# Patient Record
Sex: Female | Born: 1980
Health system: Southern US, Community
[De-identification: ages and names within clinical notes are randomized; demographics above are authoritative.]

## PROBLEM LIST (undated history)

## (undated) ENCOUNTER — Inpatient Hospital Stay (HOSPITAL_COMMUNITY): Payer: Self-pay

## (undated) DIAGNOSIS — Z8619 Personal history of other infectious and parasitic diseases: Secondary | ICD-10-CM

## (undated) DIAGNOSIS — T7840XA Allergy, unspecified, initial encounter: Secondary | ICD-10-CM

## (undated) DIAGNOSIS — Z789 Other specified health status: Secondary | ICD-10-CM

## (undated) HISTORY — DX: Allergy, unspecified, initial encounter: T78.40XA

## (undated) HISTORY — PX: NO PAST SURGERIES: SHX2092

## (undated) HISTORY — DX: Personal history of other infectious and parasitic diseases: Z86.19

---
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
## 2001-03-18 ENCOUNTER — Encounter: Payer: Self-pay | Admitting: Emergency Medicine

## 2001-03-18 ENCOUNTER — Emergency Department (HOSPITAL_COMMUNITY): Admission: EM | Admit: 2001-03-18 | Discharge: 2001-03-18 | Payer: Self-pay | Admitting: Emergency Medicine

## 2003-04-01 ENCOUNTER — Encounter: Admission: RE | Admit: 2003-04-01 | Discharge: 2003-04-01 | Payer: Self-pay | Admitting: Gastroenterology

## 2003-04-01 ENCOUNTER — Encounter: Payer: Self-pay | Admitting: Gastroenterology

## 2003-05-01 ENCOUNTER — Ambulatory Visit (HOSPITAL_COMMUNITY): Admission: RE | Admit: 2003-05-01 | Discharge: 2003-05-01 | Payer: Self-pay | Admitting: Surgery

## 2003-05-23 ENCOUNTER — Other Ambulatory Visit: Admission: RE | Admit: 2003-05-23 | Discharge: 2003-05-23 | Payer: Self-pay | Admitting: Obstetrics and Gynecology

## 2003-05-24 ENCOUNTER — Other Ambulatory Visit: Admission: RE | Admit: 2003-05-24 | Discharge: 2003-05-24 | Payer: Self-pay | Admitting: Obstetrics & Gynecology

## 2003-11-16 ENCOUNTER — Inpatient Hospital Stay (HOSPITAL_COMMUNITY): Admission: AD | Admit: 2003-11-16 | Discharge: 2003-11-16 | Payer: Self-pay | Admitting: Obstetrics and Gynecology

## 2004-01-13 ENCOUNTER — Inpatient Hospital Stay (HOSPITAL_COMMUNITY): Admission: AD | Admit: 2004-01-13 | Discharge: 2004-01-16 | Payer: Self-pay | Admitting: Obstetrics and Gynecology

## 2005-03-04 ENCOUNTER — Emergency Department (HOSPITAL_COMMUNITY): Admission: EM | Admit: 2005-03-04 | Discharge: 2005-03-04 | Payer: Self-pay | Admitting: Emergency Medicine

## 2005-12-31 ENCOUNTER — Other Ambulatory Visit: Admission: RE | Admit: 2005-12-31 | Discharge: 2005-12-31 | Payer: Self-pay | Admitting: Obstetrics and Gynecology

## 2006-08-27 ENCOUNTER — Inpatient Hospital Stay (HOSPITAL_COMMUNITY): Admission: AD | Admit: 2006-08-27 | Discharge: 2006-08-27 | Payer: Self-pay | Admitting: Obstetrics and Gynecology

## 2006-08-28 ENCOUNTER — Inpatient Hospital Stay (HOSPITAL_COMMUNITY): Admission: AD | Admit: 2006-08-28 | Discharge: 2006-08-30 | Payer: Self-pay | Admitting: Obstetrics & Gynecology

## 2009-11-24 ENCOUNTER — Ambulatory Visit (HOSPITAL_COMMUNITY): Admission: RE | Admit: 2009-11-24 | Discharge: 2009-11-24 | Payer: Self-pay | Admitting: Family Medicine

## 2012-08-24 ENCOUNTER — Emergency Department (HOSPITAL_COMMUNITY): Payer: Self-pay

## 2012-08-24 ENCOUNTER — Emergency Department (HOSPITAL_COMMUNITY)
Admission: EM | Admit: 2012-08-24 | Discharge: 2012-08-25 | Disposition: A | Discharge status: Home / Self Care | Payer: Self-pay | Attending: Emergency Medicine | Admitting: Emergency Medicine

## 2012-08-24 ENCOUNTER — Encounter (HOSPITAL_COMMUNITY): Payer: Self-pay | Admitting: Emergency Medicine

## 2012-08-24 DIAGNOSIS — Y9364 Activity, baseball: Secondary | ICD-10-CM | POA: Insufficient documentation

## 2012-08-24 DIAGNOSIS — W010XXA Fall on same level from slipping, tripping and stumbling without subsequent striking against object, initial encounter: Secondary | ICD-10-CM | POA: Insufficient documentation

## 2012-08-24 DIAGNOSIS — S40019A Contusion of unspecified shoulder, initial encounter: Secondary | ICD-10-CM | POA: Insufficient documentation

## 2012-08-24 DIAGNOSIS — S40012A Contusion of left shoulder, initial encounter: Secondary | ICD-10-CM

## 2012-08-24 MED ORDER — HYDROCODONE-ACETAMINOPHEN 5-325 MG PO TABS: 1.0000 | ORAL_TABLET | Freq: Four times a day (QID) | ORAL | Status: AC | PRN

## 2012-08-24 MED ORDER — IBUPROFEN 800 MG PO TABS
800.0000 mg | ORAL_TABLET | Freq: Once | ORAL | Status: AC
  Administered 2012-08-24: 800 mg via ORAL
  Filled 2012-08-24: qty 1

## 2012-08-24 MED ORDER — HYDROCODONE-ACETAMINOPHEN 5-325 MG PO TABS
1.0000 | ORAL_TABLET | Freq: Once | ORAL | Status: AC
  Administered 2012-08-24: 1 via ORAL
  Filled 2012-08-24: qty 1

## 2012-08-24 NOTE — ED Notes (Signed)
Patient states she was diving to catch a ball and fell on left shoulder.

## 2012-08-24 NOTE — Discharge Instructions (Signed)
Contusion A contusion is a deep bruise. Contusions happen when an injury causes bleeding under the skin. Signs of bruising include pain, puffiness (swelling), and discolored skin. The contusion may turn blue, purple, or yellow. HOME CARE   Put ice on the injured area.  Put ice in a plastic bag.  Place a towel between your skin and the bag.  Leave the ice on for 15 to 20 minutes, 3 to 4 times a day.  Only take medicine as told by your doctor.  Rest the injured area.  If possible, raise (elevate) the injured area to lessen puffiness. GET HELP RIGHT AWAY IF:   You have more bruising or puffiness.  You have pain that is getting worse.  Your puffiness or pain is not helped by medicine. MAKE SURE YOU:   Understand these instructions.  Will watch your condition.  Will get help right away if you are not doing well or get worse. Document Released: 04/12/2008 Document Revised: 01/17/2012 Document Reviewed: 08/30/2011 [facility] Patient Information 2013 [place], [place]. Cryotherapy Cryotherapy means treatment with cold. Ice or gel packs can be used to reduce both pain and swelling. Ice is the most helpful within the first 24 to 48 hours after an injury or flareup from overusing a muscle or joint. Sprains, strains, spasms, burning pain, shooting pain, and aches can all be eased with ice. Ice can also be used when recovering from surgery. Ice is effective, has very few side effects, and is safe for most people to use. PRECAUTIONS  Ice is not a safe treatment option for people with:  Raynaud's phenomenon. This is a condition affecting small blood vessels in the extremities. Exposure to cold may cause your problems to return.  Cold hypersensitivity. There are many forms of cold hypersensitivity, including:  Cold urticaria. Red, itchy hives appear on the skin when the tissues begin to warm after being iced.  Cold erythema. This is a red, itchy rash caused by exposure to cold.  Cold  hemoglobinuria. Red blood cells break down when the tissues begin to warm after being iced. The hemoglobin that carry oxygen are passed into the urine because they cannot combine with blood proteins fast enough.  Numbness or altered sensitivity in the area being iced. If you have any of the following conditions, do not use ice until you have discussed cryotherapy with your caregiver:  Heart conditions, such as arrhythmia, angina, or chronic heart disease.  High blood pressure.  Healing wounds or open skin in the area being iced.  Current infections.  Rheumatoid arthritis.  Poor circulation.  Diabetes. Ice slows the blood flow in the region it is applied. This is beneficial when trying to stop inflamed tissues from spreading irritating chemicals to surrounding tissues. However, if you expose your skin to cold temperatures for too long or without the proper protection, you can damage your skin or nerves. Watch for signs of skin damage due to cold. HOME CARE INSTRUCTIONS Follow these tips to use ice and cold packs safely.  Place a dry or damp towel between the ice and skin. A damp towel will cool the skin more quickly, so you may need to shorten the time that the ice is used.  For a more rapid response, add gentle compression to the ice.  Ice for no more than 10 to 20 minutes at a time. The bonier the area you are icing, the less time it will take to get the benefits of ice.  Check your skin after 5 minutes  to make sure there are no signs of a poor response to cold or skin damage.  Rest 20 minutes or more in between uses.  Once your skin is numb, you can end your treatment. You can test numbness by very lightly touching your skin. The touch should be so light that you do not see the skin dimple from the pressure of your fingertip. When using ice, most people will feel these normal sensations in this order: cold, burning, aching, and numbness.  Do not use ice on someone who cannot  communicate their responses to pain, such as small children or people with dementia. HOW TO MAKE AN ICE PACK Ice packs are the most common way to use ice therapy. Other methods include ice massage, ice baths, and cryo-sprays. Muscle creams that cause a cold, tingly feeling do not offer the same benefits that ice offers and should not be used as a substitute unless recommended by your caregiver. To make an ice pack, do one of the following:  Place crushed ice or a bag of frozen vegetables in a sealable plastic bag. Squeeze out the excess air. Place this bag inside another plastic bag. Slide the bag into a pillowcase or place a damp towel between your skin and the bag.  Mix 3 parts water with 1 part rubbing alcohol. Freeze the mixture in a sealable plastic bag. When you remove the mixture from the freezer, it will be slushy. Squeeze out the excess air. Place this bag inside another plastic bag. Slide the bag into a pillowcase or place a damp towel between your skin and the bag. SEEK MEDICAL CARE IF:  You develop white spots on your skin. This may give the skin a blotchy (mottled) appearance.  Your skin turns blue or pale.  Your skin becomes waxy or hard.  Your swelling gets worse. MAKE SURE YOU:   Understand these instructions.  Will watch your condition.  Will get help right away if you are not doing well or get worse. Document Released: 06/21/2011 Document Revised: 01/17/2012 Document Reviewed: 06/21/2011 [facility] Patient Information 2013 [place], [place].   Wear the sling for comfort.  Take ibuprofen up to 800 mg every 8 hrs with food.  Take the pain medicine as directed.  Apply ice several times daily.  Follow up with dr. Hilda Lias.

## 2012-08-24 NOTE — ED Provider Notes (Signed)
History     CSN: [id number]  Arrival date & time 08/24/12  2238   First MD Initiated Contact with Patient 08/24/12 2258      Chief Complaint  Patient presents with  . Shoulder Injury    (Consider location/radiation/quality/duration/timing/severity/associated sxs/prior treatment) HPI Comments: Pt was playing softball tonight.  She dove and caught a fly ball and landed on her L shoulder.  No other injuries or complaints.  R hand dominant.  Patient is a 31 y.o. female presenting with shoulder injury. The history is provided by the patient. No language interpreter was used.  Shoulder Injury This is a new problem. The current episode started today. The problem occurs constantly. The problem has been unchanged. Pertinent negatives include no chills, fever or weakness. She has tried nothing for the symptoms.    History reviewed. No pertinent past medical history.  History reviewed. No pertinent past surgical history.  No family history on file.  History  Substance Use Topics  . Smoking status: Never Smoker   . Smokeless tobacco: Not on file  . Alcohol Use: Yes     occ    OB History    Grav Para Term Preterm Abortions TAB SAB Ect Mult Living                  Review of Systems  Constitutional: Negative for fever and chills.  Musculoskeletal:       Shoulder injury.    Neurological: Negative for weakness.       Tingling in L arm   All other systems reviewed and are negative.    Allergies  Review of patient's allergies indicates no known allergies.  Home Medications   Current Outpatient Rx  Name Route Sig Dispense Refill  . HYDROCODONE-ACETAMINOPHEN 5-325 MG PO TABS Oral Take 1 tablet by mouth every 6 (six) hours as needed for pain. 12 tablet 0    BP 152/93  Pulse 73  Temp 97.3 F (36.3 C) (Oral)  Resp 20  Ht 5\' 10"  (1.778 m)  Wt 155 lb (70.308 kg)  BMI 22.24 kg/m2  SpO2 100%  Physical Exam  Nursing note and vitals reviewed. Constitutional: She is  oriented to person, place, and time. She appears well-developed and well-nourished. No distress.  HENT:  Head: Normocephalic and atraumatic.  Eyes: EOM are normal.  Neck: Normal range of motion.  Cardiovascular: Normal rate, regular rhythm and normal heart sounds.   Pulmonary/Chest: Effort normal.  Abdominal: Soft. She exhibits no distension. There is no tenderness.  Musculoskeletal: She exhibits tenderness.       Left shoulder: She exhibits decreased range of motion, tenderness, bony tenderness and pain. She exhibits no swelling, no effusion, no crepitus, no deformity, no laceration, no spasm, normal pulse and normal strength.       Arms: Neurological: She is alert and oriented to person, place, and time. Coordination normal.  Skin: Skin is warm and dry.  Psychiatric: She has a normal mood and affect. Judgment normal.    ED Course  Procedures (including critical care time)  Labs Reviewed - No data to display Dg Shoulder Left  08/24/2012  *RADIOLOGY REPORT*  Clinical Data: Shoulder injury.  Pain and swelling.  LEFT SHOULDER - 2+ VIEW  Comparison: None.  Findings: Somewhat prominent distal left clavicle which could be degenerative or related to remote trauma.  No acute fracture or dislocation. Visualized portion of the left hemithorax is normal.  IMPRESSION: No acute osseous abnormality.   Original Report Authenticated By: Ronaldo Miyamoto  D. TALBOT, M.D.      1. Contusion of left shoulder       MDM  Sling, ice, ibuprofen rx-hydrocodone, 12 F/u with dr. Hilda Lias.        Evalina Field, [place] 08/24/12 2340

## 2012-08-25 NOTE — ED Notes (Signed)
Discharge instructions given and reviewed with patient.  Prescription given for Vicodin; effects and use explained.  Patient verbalized understanding of sedating effects of medication and to follow up with orthopedics as needed.  Patient ambulatory; discharged home in good condition.

## 2012-08-25 NOTE — ED Provider Notes (Signed)
Medical screening examination/treatment/procedure(s) were performed by non-physician practitioner and as supervising physician I was immediately available for consultation/collaboration.  Cristiana Yochim L Carletta Feasel, MD 08/25/12 0416 

## 2013-01-03 ENCOUNTER — Encounter: Payer: Self-pay | Admitting: Orthopedic Surgery

## 2013-01-03 ENCOUNTER — Ambulatory Visit: Payer: Self-pay | Admitting: Orthopedic Surgery

## 2015-11-09 NOTE — L&D Delivery Note (Signed)
Patient was C/C/+2 and pushed for 5 minutes with epidural.   NSVD  female infant, Apgars 8,9, weight P.   The patient had no lacerations. Fundus was firm. EBL was expected amount. Placenta was delivered intact. Vagina was clear.  Baby was vigorous and doing skin to skin with mother.  Vanessa Sellers

## 2015-11-19 LAB — OB RESULTS CONSOLE GC/CHLAMYDIA
Chlamydia: NEGATIVE
Gonorrhea: NEGATIVE

## 2015-11-19 LAB — OB RESULTS CONSOLE RPR: RPR: NONREACTIVE

## 2015-11-19 LAB — OB RESULTS CONSOLE HIV ANTIBODY (ROUTINE TESTING): HIV: NONREACTIVE

## 2015-11-19 LAB — OB RESULTS CONSOLE HEPATITIS B SURFACE ANTIGEN: HEP B S AG: NEGATIVE

## 2015-11-19 LAB — OB RESULTS CONSOLE ANTIBODY SCREEN: ANTIBODY SCREEN: NEGATIVE

## 2015-11-19 LAB — OB RESULTS CONSOLE RUBELLA ANTIBODY, IGM: RUBELLA: IMMUNE

## 2015-11-19 LAB — OB RESULTS CONSOLE ABO/RH: RH Type: POSITIVE

## 2015-12-13 ENCOUNTER — Encounter (HOSPITAL_COMMUNITY): Payer: Self-pay | Admitting: *Deleted

## 2015-12-13 ENCOUNTER — Inpatient Hospital Stay (HOSPITAL_COMMUNITY)
Admission: AD | Admit: 2015-12-13 | Discharge: 2015-12-13 | Disposition: A | Discharge status: Home / Self Care | Payer: BLUE CROSS/BLUE SHIELD | Source: Ambulatory Visit | Attending: Obstetrics | Admitting: Obstetrics

## 2015-12-13 DIAGNOSIS — O209 Hemorrhage in early pregnancy, unspecified: Secondary | ICD-10-CM | POA: Insufficient documentation

## 2015-12-13 DIAGNOSIS — O4692 Antepartum hemorrhage, unspecified, second trimester: Secondary | ICD-10-CM

## 2015-12-13 DIAGNOSIS — Z3A17 17 weeks gestation of pregnancy: Secondary | ICD-10-CM | POA: Diagnosis not present

## 2015-12-13 HISTORY — DX: Other specified health status: Z78.9

## 2015-12-13 NOTE — Discharge Instructions (Signed)
Vaginal Bleeding During Pregnancy, Second Trimester °A small amount of bleeding (spotting) from the vagina is relatively common in pregnancy. It usually stops on its own. Various things can cause bleeding or spotting in pregnancy. Some bleeding may be related to the pregnancy, and some may not. Sometimes the bleeding is normal and is not a problem. However, bleeding can also be a sign of something serious. Be sure to tell your health care provider about any vaginal bleeding right away. °Some possible causes of vaginal bleeding during the second trimester include: °· Infection, inflammation, or growths on the cervix.   °· The placenta may be partially or completely covering the opening of the cervix inside the uterus (placenta previa). °· The placenta may have separated from the uterus (abruption of the placenta).   °· You may be having early (preterm) labor.   °· The cervix may not be strong enough to keep a baby inside the uterus (cervical insufficiency).   °· Tiny cysts may have developed in the uterus instead of pregnancy tissue (molar pregnancy).  °HOME CARE INSTRUCTIONS  °Watch your condition for any changes. The following actions may help to lessen any discomfort you are feeling: °· Follow your health care provider's instructions for limiting your activity. If your health care provider orders bed rest, you may need to stay in bed and only get up to use the bathroom. However, your health care provider may allow you to continue light activity. °· If needed, make plans for someone to help with your regular activities and responsibilities while you are on bed rest. °· Keep track of the number of pads you use each day, how often you change pads, and how soaked (saturated) they are. Write this down. °· Do not use tampons. Do not douche. °· Do not have sexual intercourse or orgasms until approved by your health care provider. °· If you pass any tissue from your vagina, save the tissue so you can show it to your  health care provider. °· Only take over-the-counter or prescription medicines as directed by your health care provider. °· Do not take aspirin because it can make you bleed. °· Do not exercise or perform any strenuous activities or heavy lifting without your health care provider's permission. °· Keep all follow-up appointments as directed by your health care provider. °SEEK MEDICAL CARE IF: °· You have any vaginal bleeding during any part of your pregnancy. °· You have cramps or labor pains. °· You have a fever, not controlled by medicine. °SEEK IMMEDIATE MEDICAL CARE IF:  °· You have severe cramps in your back or belly (abdomen). °· You have contractions. °· You have chills. °· You pass large clots or tissue from your vagina. °· Your bleeding increases. °· You feel light-headed or weak, or you have fainting episodes. °· You are leaking fluid or have a gush of fluid from your vagina. °MAKE SURE YOU: °· Understand these instructions. °· Will watch your condition. °· Will get help right away if you are not doing well or get worse. °  °This information is not intended to replace advice given to you by your health care provider. Make sure you discuss any questions you have with your health care provider. °  °Document Released: 08/04/2005 Document Revised: 10/30/2013 Document Reviewed: 07/02/2013 °Elsevier Interactive Patient Education ©2016 [employer] ° ° °Pelvic Rest °Pelvic rest is sometimes recommended for women when:  °· The placenta is partially or completely covering the opening of the cervix (placenta previa). °· There is bleeding between the uterine wall   and the amniotic sac in the first trimester (subchorionic hemorrhage). °· The cervix begins to open without labor starting (incompetent cervix, cervical insufficiency). °· The labor is too early (preterm labor). °HOME CARE INSTRUCTIONS °· Do not have sexual intercourse, stimulation, or an orgasm. °· Do not use tampons, douche, or put anything in the  vagina. °· Do not lift anything over 10 pounds (4.5 kg). °· Avoid strenuous activity or straining your pelvic muscles. °SEEK MEDICAL CARE IF:  °· You have any vaginal bleeding during pregnancy. Treat this as a potential emergency. °· You have cramping pain felt low in the stomach (stronger than menstrual cramps). °· You notice vaginal discharge (watery, mucus, or bloody). °· You have a low, dull backache. °· There are regular contractions or uterine tightening. °SEEK IMMEDIATE MEDICAL CARE IF: °You have vaginal bleeding and have placenta previa.  °  °This information is not intended to replace advice given to you by your health care provider. Make sure you discuss any questions you have with your health care provider. °  °Document Released: 02/19/2011 Document Revised: 01/17/2012 Document Reviewed: 04/28/2015 °Elsevier Interactive Patient Education ©2016 Elsevier Inc. ° °

## 2015-12-13 NOTE — MAU Note (Signed)
Pt reports she satrted seeing blood when she wiped  And is starting to stain her underwear.Pt reports some cramping now and nausea. Pt is very nervous

## 2015-12-13 NOTE — Progress Notes (Signed)
Nada Maclachlan PA in to see pt earlier and discuss d/c plan. Written and verbal d/c instructions given and understanding voiced

## 2015-12-13 NOTE — MAU Provider Note (Signed)
History     CSN: [id number]  Arrival date and time: 12/13/15 2128   First Provider Initiated Contact with Patient 12/13/15 2159      Chief Complaint  Patient presents with  . Vaginal Bleeding   HPI Vanessa Sellers 35 y.o. [id number]  presents to MAU complaining of bleeding.  It was first noted at 6pm today.  It was noted on going to the bathroom initially.  Through the last few hours, she has had slightly more than spotting but not bleeding heavily.  She has not soaked any pads.  She feels a bit crampy on the inside of the vagina and lower stomach.  No problems eating or drinking today.  She has some constipation but denies dysuria, weakness, fever.  She thinks she feels some fetal movements.   OB History    Gravida Para Term Preterm AB TAB SAB Ectopic Multiple Living   Past Medical History  Diagnosis Date  . Medical history non-contributory     Past Surgical History  Procedure Laterality Date  . No past surgeries      History reviewed. No pertinent family history.  Social History  Substance Use Topics  . Smoking status: Never Smoker   . Smokeless tobacco: Never Used  . Alcohol Use: No    Allergies: No Known Allergies  Prescriptions prior to admission  Medication Sig Dispense Refill Last Dose  . acetaminophen (TYLENOL) 325 MG tablet Take 325 mg by mouth every 6 (six) hours as needed for headache.   three weeks  . hydrocortisone cream 1 % Apply 1 application topically daily as needed for itching (rash).   12/12/2015 at Unknown time  . Prenatal Vit-Fe Fumarate-FA (PRENATAL MULTIVITAMIN) TABS tablet Take 1 tablet by mouth daily at 12 noon.   12/12/2015 at Unknown time  . ranitidine (ZANTAC) 150 MG tablet Take 150 mg by mouth 2 (two) times daily as needed for heartburn.   12/12/2015 at Unknown time    ROS Pertinent ROS in HPI.  All other systems are negative.   Physical Exam   Blood pressure 159/85, pulse 94, temperature 98.4 F (36.9 C), temperature  source Oral, resp. rate 18, height  (1.778 m), weight 188 lb 3.2 oz (85.367 kg).  Physical Exam  Constitutional: She is oriented to person, place, and time. She appears well-developed and well-nourished. No distress.  HENT:  Head: Normocephalic and atraumatic.  Eyes: Conjunctivae and EOM are normal.  Neck: Normal range of motion. Neck supple.  Cardiovascular: Normal rate.   Respiratory: Effort normal. No respiratory distress.  GI: Soft. She exhibits no distension. There is no tenderness.  Genitourinary:  Scant streaks of red blood with some brown discharge mixed in.  No active bleeding from os.  No CMT.  No adnexal mass or tenderness appreciated.    Musculoskeletal: Normal range of motion.  Neurological: She is alert and oriented to person, place, and time.  Skin: Skin is warm and dry.  Psychiatric: She has a normal mood and affect. Her behavior is normal.    MAU Course  Procedures  MDM Fetal heart tones 153 on triage. Dr. Chestine Spore consulted.  She advises no formal u/s needed at this time, but would appreciate bedside u/s.  Joellyn Haff, CNM called to bedside for this.  Baby is moving well with good cardiac activity noted and no obvious issue.  Patient reassured.   Per Dr. Chestine Spore - patient may  be discharged.    Assessment and Plan  A: Bleeding in pregnancy   P: Discharge to home  Pelvic rest F/u in clinic If increased bleeding - return to MAU  Patient may return to MAU as needed or if her condition were to change or worsen   Bertram Denver 12/13/2015, 10:00 PM

## 2016-02-25 DIAGNOSIS — Z3480 Encounter for supervision of other normal pregnancy, unspecified trimester: Secondary | ICD-10-CM | POA: Diagnosis not present

## 2016-02-25 DIAGNOSIS — O4403 Placenta previa specified as without hemorrhage, third trimester: Secondary | ICD-10-CM | POA: Diagnosis not present

## 2016-03-17 DIAGNOSIS — Z23 Encounter for immunization: Secondary | ICD-10-CM | POA: Diagnosis not present

## 2016-03-17 DIAGNOSIS — Z36 Encounter for antenatal screening of mother: Secondary | ICD-10-CM | POA: Diagnosis not present

## 2016-04-21 DIAGNOSIS — Z36 Encounter for antenatal screening of mother: Secondary | ICD-10-CM | POA: Diagnosis not present

## 2016-04-21 DIAGNOSIS — Z3480 Encounter for supervision of other normal pregnancy, unspecified trimester: Secondary | ICD-10-CM | POA: Diagnosis not present

## 2016-04-21 LAB — OB RESULTS CONSOLE GBS: GBS: NEGATIVE

## 2016-04-30 ENCOUNTER — Inpatient Hospital Stay (HOSPITAL_COMMUNITY): Payer: BLUE CROSS/BLUE SHIELD

## 2016-04-30 ENCOUNTER — Encounter (HOSPITAL_COMMUNITY): Payer: Self-pay

## 2016-04-30 ENCOUNTER — Inpatient Hospital Stay (HOSPITAL_COMMUNITY)
Admission: AD | Admit: 2016-04-30 | Discharge: 2016-05-03 | DRG: 775 | Disposition: A | Discharge status: Home / Self Care | Payer: BLUE CROSS/BLUE SHIELD | Source: Ambulatory Visit | Attending: Obstetrics and Gynecology | Admitting: Obstetrics and Gynecology

## 2016-04-30 DIAGNOSIS — O4202 Full-term premature rupture of membranes, onset of labor within 24 hours of rupture: Secondary | ICD-10-CM | POA: Diagnosis not present

## 2016-04-30 DIAGNOSIS — Z23 Encounter for immunization: Secondary | ICD-10-CM | POA: Diagnosis not present

## 2016-04-30 DIAGNOSIS — Z36 Encounter for antenatal screening of mother: Secondary | ICD-10-CM | POA: Diagnosis not present

## 2016-04-30 DIAGNOSIS — Z3A37 37 weeks gestation of pregnancy: Secondary | ICD-10-CM | POA: Diagnosis not present

## 2016-04-30 DIAGNOSIS — Z3A36 36 weeks gestation of pregnancy: Secondary | ICD-10-CM

## 2016-04-30 DIAGNOSIS — O4290 Premature rupture of membranes, unspecified as to length of time between rupture and onset of labor, unspecified weeks of gestation: Secondary | ICD-10-CM

## 2016-04-30 DIAGNOSIS — Z3689 Encounter for other specified antenatal screening: Secondary | ICD-10-CM

## 2016-04-30 LAB — CBC
HCT: 32.8 % — ABNORMAL LOW (ref 36.0–46.0)
HEMOGLOBIN: 11.3 g/dL — AB (ref 12.0–15.0)
MCH: 29.5 pg (ref 26.0–34.0)
MCHC: 34.5 g/dL (ref 30.0–36.0)
MCV: 85.6 fL (ref 78.0–100.0)
Platelets: 186 10*3/uL (ref 150–400)
RBC: 3.83 MIL/uL — ABNORMAL LOW (ref 3.87–5.11)
RDW: 13.4 % (ref 11.5–15.5)
WBC: 11 10*3/uL — ABNORMAL HIGH (ref 4.0–10.5)

## 2016-04-30 LAB — TYPE AND SCREEN
ABO/RH(D): B POS
ANTIBODY SCREEN: NEGATIVE

## 2016-04-30 LAB — POCT FERN TEST: POCT FERN TEST: POSITIVE

## 2016-04-30 MED ORDER — SOD CITRATE-CITRIC ACID 500-334 MG/5ML PO SOLN: 30.0000 mL | ORAL | Status: DC | PRN

## 2016-04-30 MED ORDER — LACTATED RINGERS IV SOLN
INTRAVENOUS | Status: DC
  Administered 2016-04-30 – 2016-05-01 (×2): via INTRAVENOUS

## 2016-04-30 MED ORDER — FLEET ENEMA 7-19 GM/118ML RE ENEM: 1.0000 | ENEMA | RECTAL | Status: DC | PRN

## 2016-04-30 MED ORDER — OXYTOCIN 40 UNITS IN LACTATED RINGERS INFUSION - SIMPLE MED: 2.5000 [IU]/h | INTRAVENOUS | Status: DC

## 2016-04-30 MED ORDER — OXYTOCIN 40 UNITS IN LACTATED RINGERS INFUSION - SIMPLE MED
1.0000 m[IU]/min | INTRAVENOUS | Status: DC
  Administered 2016-05-01: 2 m[IU]/min via INTRAVENOUS
  Filled 2016-04-30: qty 1000

## 2016-04-30 MED ORDER — LACTATED RINGERS IV SOLN: 500.0000 mL | INTRAVENOUS | Status: DC | PRN

## 2016-04-30 MED ORDER — ACETAMINOPHEN 325 MG PO TABS
650.0000 mg | ORAL_TABLET | ORAL | Status: DC | PRN
  Administered 2016-05-01: 650 mg via ORAL
  Filled 2016-04-30: qty 2

## 2016-04-30 MED ORDER — BUTORPHANOL TARTRATE 1 MG/ML IJ SOLN: 1.0000 mg | INTRAMUSCULAR | Status: DC | PRN

## 2016-04-30 MED ORDER — OXYCODONE-ACETAMINOPHEN 5-325 MG PO TABS: 2.0000 | ORAL_TABLET | ORAL | Status: DC | PRN

## 2016-04-30 MED ORDER — LIDOCAINE HCL (PF) 1 % IJ SOLN
30.0000 mL | INTRAMUSCULAR | Status: DC | PRN
  Filled 2016-04-30: qty 30

## 2016-04-30 MED ORDER — ONDANSETRON HCL 4 MG/2ML IJ SOLN
4.0000 mg | Freq: Four times a day (QID) | INTRAMUSCULAR | Status: DC | PRN
  Administered 2016-05-01: 4 mg via INTRAVENOUS
  Filled 2016-04-30: qty 2

## 2016-04-30 MED ORDER — TERBUTALINE SULFATE 1 MG/ML IJ SOLN
0.2500 mg | Freq: Once | INTRAMUSCULAR | Status: DC | PRN
  Filled 2016-04-30: qty 1

## 2016-04-30 MED ORDER — OXYCODONE-ACETAMINOPHEN 5-325 MG PO TABS: 1.0000 | ORAL_TABLET | ORAL | Status: DC | PRN

## 2016-04-30 MED ORDER — OXYTOCIN BOLUS FROM INFUSION
500.0000 mL | INTRAVENOUS | Status: DC
  Administered 2016-05-01: 500 mL via INTRAVENOUS

## 2016-04-30 NOTE — MAU Note (Signed)
At 5 pm started leaking ? fluid.   Clear.  Cramping low abd.  Baby moving well. No bleeding.

## 2016-05-01 ENCOUNTER — Inpatient Hospital Stay (HOSPITAL_COMMUNITY): Payer: BLUE CROSS/BLUE SHIELD | Admitting: Anesthesiology

## 2016-05-01 ENCOUNTER — Encounter (HOSPITAL_COMMUNITY): Payer: Self-pay | Admitting: *Deleted

## 2016-05-01 LAB — ABO/RH: ABO/RH(D): B POS

## 2016-05-01 LAB — RPR: RPR Ser Ql: NONREACTIVE

## 2016-05-01 MED ORDER — METHYLERGONOVINE MALEATE 0.2 MG PO TABS
0.2000 mg | ORAL_TABLET | ORAL | Status: DC | PRN
Start: 2016-05-01 — End: 2016-05-03

## 2016-05-01 MED ORDER — WITCH HAZEL-GLYCERIN EX PADS: 1.0000 "application " | MEDICATED_PAD | CUTANEOUS | Status: DC | PRN

## 2016-05-01 MED ORDER — PRENATAL MULTIVITAMIN CH
1.0000 | ORAL_TABLET | Freq: Every day | ORAL | Status: DC
  Administered 2016-05-02 – 2016-05-03 (×2): 1 via ORAL
  Filled 2016-05-01 (×2): qty 1

## 2016-05-01 MED ORDER — PHENYLEPHRINE 40 MCG/ML (10ML) SYRINGE FOR IV PUSH (FOR BLOOD PRESSURE SUPPORT)
80.0000 ug | PREFILLED_SYRINGE | INTRAVENOUS | Status: DC | PRN
  Filled 2016-05-01: qty 5
  Filled 2016-05-01: qty 10

## 2016-05-01 MED ORDER — SODIUM CHLORIDE 0.9 % IV SOLN: 250.0000 mL | INTRAVENOUS | Status: DC | PRN

## 2016-05-01 MED ORDER — SODIUM CHLORIDE 0.9 % IV SOLN
2.0000 g | Freq: Four times a day (QID) | INTRAVENOUS | Status: DC
  Administered 2016-05-01: 2 g via INTRAVENOUS
  Filled 2016-05-01 (×2): qty 2000

## 2016-05-01 MED ORDER — LACTATED RINGERS IV SOLN: 500.0000 mL | Freq: Once | INTRAVENOUS | Status: DC

## 2016-05-01 MED ORDER — SODIUM CHLORIDE 0.9% FLUSH: 3.0000 mL | Freq: Two times a day (BID) | INTRAVENOUS | Status: DC

## 2016-05-01 MED ORDER — DIPHENHYDRAMINE HCL 50 MG/ML IJ SOLN: 12.5000 mg | INTRAMUSCULAR | Status: DC | PRN

## 2016-05-01 MED ORDER — SODIUM CHLORIDE 0.9% FLUSH: 3.0000 mL | INTRAVENOUS | Status: DC | PRN

## 2016-05-01 MED ORDER — DIBUCAINE 1 % RE OINT: 1.0000 "application " | TOPICAL_OINTMENT | RECTAL | Status: DC | PRN

## 2016-05-01 MED ORDER — IBUPROFEN 800 MG PO TABS
800.0000 mg | ORAL_TABLET | Freq: Three times a day (TID) | ORAL | Status: DC
  Administered 2016-05-01 – 2016-05-03 (×5): 800 mg via ORAL
  Filled 2016-05-01 (×5): qty 1

## 2016-05-01 MED ORDER — FERROUS SULFATE 325 (65 FE) MG PO TABS
325.0000 mg | ORAL_TABLET | Freq: Two times a day (BID) | ORAL | Status: DC
  Administered 2016-05-02 – 2016-05-03 (×3): 325 mg via ORAL
  Filled 2016-05-01 (×3): qty 1

## 2016-05-01 MED ORDER — COCONUT OIL OIL
1.0000 "application " | TOPICAL_OIL | Status: DC | PRN
  Administered 2016-05-03: 1 via TOPICAL
  Filled 2016-05-01: qty 120

## 2016-05-01 MED ORDER — ZOLPIDEM TARTRATE 5 MG PO TABS: 5.0000 mg | ORAL_TABLET | Freq: Every evening | ORAL | Status: DC | PRN

## 2016-05-01 MED ORDER — PHENYLEPHRINE 40 MCG/ML (10ML) SYRINGE FOR IV PUSH (FOR BLOOD PRESSURE SUPPORT)
80.0000 ug | PREFILLED_SYRINGE | INTRAVENOUS | Status: DC | PRN
  Filled 2016-05-01: qty 5

## 2016-05-01 MED ORDER — ONDANSETRON HCL 4 MG PO TABS: 4.0000 mg | ORAL_TABLET | ORAL | Status: DC | PRN

## 2016-05-01 MED ORDER — ONDANSETRON HCL 4 MG/2ML IJ SOLN: 4.0000 mg | INTRAMUSCULAR | Status: DC | PRN

## 2016-05-01 MED ORDER — OXYCODONE-ACETAMINOPHEN 5-325 MG PO TABS: 2.0000 | ORAL_TABLET | ORAL | Status: DC | PRN

## 2016-05-01 MED ORDER — SIMETHICONE 80 MG PO CHEW: 80.0000 mg | CHEWABLE_TABLET | ORAL | Status: DC | PRN

## 2016-05-01 MED ORDER — EPHEDRINE 5 MG/ML INJ
10.0000 mg | INTRAVENOUS | Status: DC | PRN
  Filled 2016-05-01: qty 2

## 2016-05-01 MED ORDER — DIPHENHYDRAMINE HCL 25 MG PO CAPS: 25.0000 mg | ORAL_CAPSULE | Freq: Four times a day (QID) | ORAL | Status: DC | PRN

## 2016-05-01 MED ORDER — TETANUS-DIPHTH-ACELL PERTUSSIS 5-2.5-18.5 LF-MCG/0.5 IM SUSP: 0.5000 mL | Freq: Once | INTRAMUSCULAR | Status: DC

## 2016-05-01 MED ORDER — ACETAMINOPHEN 325 MG PO TABS: 650.0000 mg | ORAL_TABLET | ORAL | Status: DC | PRN

## 2016-05-01 MED ORDER — SENNOSIDES-DOCUSATE SODIUM 8.6-50 MG PO TABS
2.0000 | ORAL_TABLET | ORAL | Status: DC
  Administered 2016-05-01: 2 via ORAL
  Filled 2016-05-01: qty 2

## 2016-05-01 MED ORDER — FAMOTIDINE 20 MG PO TABS
10.0000 mg | ORAL_TABLET | Freq: Two times a day (BID) | ORAL | Status: DC
  Administered 2016-05-01 – 2016-05-03 (×3): 10 mg via ORAL
  Filled 2016-05-01 (×4): qty 1

## 2016-05-01 MED ORDER — MAGNESIUM HYDROXIDE 400 MG/5ML PO SUSP: 30.0000 mL | ORAL | Status: DC | PRN

## 2016-05-01 MED ORDER — BENZOCAINE-MENTHOL 20-0.5 % EX AERO
1.0000 "application " | INHALATION_SPRAY | CUTANEOUS | Status: DC | PRN
  Administered 2016-05-02: 1 via TOPICAL
  Filled 2016-05-01: qty 56

## 2016-05-01 MED ORDER — FENTANYL 2.5 MCG/ML BUPIVACAINE 1/10 % EPIDURAL INFUSION (WH - ANES)
14.0000 mL/h | INTRAMUSCULAR | Status: DC | PRN
  Administered 2016-05-01 (×2): 14 mL/h via EPIDURAL
  Filled 2016-05-01 (×2): qty 125

## 2016-05-01 MED ORDER — MEASLES, MUMPS & RUBELLA VAC ~~LOC~~ INJ
0.5000 mL | INJECTION | Freq: Once | SUBCUTANEOUS | Status: DC
  Filled 2016-05-01: qty 0.5

## 2016-05-01 MED ORDER — METHYLERGONOVINE MALEATE 0.2 MG/ML IJ SOLN: 0.2000 mg | INTRAMUSCULAR | Status: DC | PRN

## 2016-05-01 MED ORDER — OXYCODONE-ACETAMINOPHEN 5-325 MG PO TABS: 1.0000 | ORAL_TABLET | ORAL | Status: DC | PRN

## 2016-05-01 MED ORDER — LIDOCAINE HCL (PF) 1 % IJ SOLN
INTRAMUSCULAR | Status: DC | PRN
  Administered 2016-05-01: 4 mL
  Administered 2016-05-01: 6 mL via EPIDURAL

## 2016-05-01 NOTE — Anesthesia Preprocedure Evaluation (Signed)

## 2016-05-01 NOTE — H&P (Signed)
35 y.o. [id number]  [id number] comes in c/o rupture at 1700 yesterday.  Otherwise has good fetal movement and no bleeding.  Past Medical History  Diagnosis Date  . Medical history non-contributory     Past Surgical History  Procedure Laterality Date  . No past surgeries      OB History  Gravida Para Term Preterm AB SAB TAB Ectopic Multiple Living  4 2 2  1 1    2     # Outcome Date GA Lbr Len/2nd Weight Sex Delivery Anes PTL Lv  4 Current           3 SAB           2 Term           1 Term               Social History   Social History  . Marital Status: Married    Spouse Name: N/A  . Number of Children: N/A  . Years of Education: N/A   Occupational History  . Not on file.   Social History Main Topics  . Smoking status: Never Smoker   . Smokeless tobacco: Never Used  . Alcohol Use: No  . Drug Use: No  . Sexual Activity: Yes    Birth Control/ Protection: None   Other Topics Concern  . Not on file   Social History Narrative   Review of patient's allergies indicates no known allergies.    Prenatal Transfer Tool  Maternal Diabetes: No Genetic Screening: Normal Maternal Ultrasounds/Referrals: Normal Fetal Ultrasounds or other Referrals:  None Maternal Substance Abuse:  No Significant Maternal Medications:  None Significant Maternal Lab Results: None  Other PNC: uncomplicated.    Filed Vitals:   05/01/16 0600 05/01/16 0630  BP: 120/73 121/82  Pulse: 70 75  Temp:    Resp:       Lungs/Cor:  NAD Abdomen:  soft, gravid Ex:  no cords, erythema SVE:  3/60/-2 FHTs:  120s, good STV, NST R Toco:  q 3-4   A/P   PROM- on pitocin now.  GBS neg.   Taci Sterling A

## 2016-05-01 NOTE — Anesthesia Pain Management Evaluation Note (Signed)
  CRNA Pain Management Visit Note  Patient: Vanessa Sellers, 35 y.o., female  "Hello I am a [facility] of the [facility]. We have an anesthesia team available at all times to provide care throughout the hospital, including epidural management and anesthesia for C-section. I don't know your plan for the delivery whether it a natural birth, water birth, IV sedation, nitrous supplementation, doula or epidural, but we want to meet your pain goals."   1.Was your pain managed to your expectations on prior hospitalizations?   Unable to assess - patient sleeping  2.What is your expectation for pain management during this hospitalization?   Patient sleeing  3.How can we help you reach that goal? Patient sleeping  Record the patient's initial score and the patient's pain goal.   Pain: Patient sleeping - unable to assess  Pain Goal: Patient sleeping - unable to assess [facility] wants you to be able to say your pain was always managed very well.  [facility],Orvell Careaga 05/01/2016

## 2016-05-01 NOTE — Anesthesia Procedure Notes (Signed)
Epidural Patient location during procedure: OB  Preanesthetic Checklist Completed: patient identified, site marked, surgical consent, pre-op evaluation, timeout performed, IV checked, risks and benefits discussed and monitors and equipment checked  Epidural Patient position: sitting Prep: site prepped and draped and DuraPrep Patient monitoring: continuous pulse ox and blood pressure Approach: midline Location: L3-L4 Injection technique: LOR air  Needle:  Needle type: Tuohy  Needle gauge: 17 G Needle length: 9 cm and 9 Needle insertion depth: 5 cm cm Catheter type: closed end flexible Catheter size: 19 Gauge Catheter at skin depth: 10 cm Test dose: negative  Assessment Events: blood not aspirated, injection not painful, no injection resistance, negative IV test and no paresthesia  Additional Notes Scoliosis noted  Dosing of Epidural:  1st dose, through catheter ............................................Marland Kitchen.  Xylocaine 40 mg  2nd dose, through catheter, after waiting 3 minutes........Marland Kitchen.Xylocaine 60 mg    As each dose occurred, patient was free of IV sx; and patient exhibited no evidence of SA injection.  Patient is more comfortable after epidural dosed. Please see RN's note for documentation of vital signs,and FHR which are stable.  Patient reminded not to try to ambulate with numb legs, and that an RN must be present when she attempts to get up.

## 2016-05-01 NOTE — Anesthesia Pain Management Evaluation Note (Signed)
  CRNA Pain Management Visit Note  Patient: Vanessa Sellers, 35 y.o., female  "Hello I am a member of the anesthesia team at [facility]. We have an anesthesia team available at all times to provide care throughout the hospital, including epidural management and anesthesia for C-section. I don't know your plan for the delivery whether it a natural birth, water birth, IV sedation, nitrous supplementation, doula or epidural, but we want to meet your pain goals."   1.Was your pain managed to your expectations on prior hospitalizations?     2.What is your expectation for pain management during this hospitalization?      3.How can we help you reach that goal?   Record the patient's initial score and the patient's pain goal.   Pain: 7  Pain Goal: 4 [facility] wants you to be able to say your pain was always managed very well.  Laban EmperorMalinova,Izick Gasbarro Hristova 05/01/2016

## 2016-05-02 LAB — CBC
HEMATOCRIT: 30.4 % — AB (ref 36.0–46.0)
Hemoglobin: 10.5 g/dL — ABNORMAL LOW (ref 12.0–15.0)
MCH: 29.9 pg (ref 26.0–34.0)
MCHC: 34.5 g/dL (ref 30.0–36.0)
MCV: 86.6 fL (ref 78.0–100.0)
Platelets: 157 10*3/uL (ref 150–400)
RBC: 3.51 MIL/uL — ABNORMAL LOW (ref 3.87–5.11)
RDW: 13.7 % (ref 11.5–15.5)
WBC: 16.2 10*3/uL — ABNORMAL HIGH (ref 4.0–10.5)

## 2016-05-02 NOTE — Discharge Summary (Signed)
Obstetric Discharge Summary Reason for Admission: rupture of membranes Prenatal Procedures: ultrasound Intrapartum Procedures: spontaneous vaginal delivery Postpartum Procedures: none Complications-Operative and Postpartum: none HEMOGLOBIN  Date Value Ref Range Status  05/02/2016 10.5* 12.0 - 15.0 g/dL Final   HCT  Date Value Ref Range Status  05/02/2016 30.4* 36.0 - 46.0 % Final   Discharge Diagnoses: Term Pregnancy-delivered  Discharge Information: Date: 05/02/2016 Activity: pelvic rest Diet: routine Medications: None Condition: stable Instructions: refer to practice specific booklet Discharge to: home Follow-up Information    Follow up with Manessa Buley A, MD In 4 weeks.   Specialty:  Obstetrics and Gynecology   Contact information:   [address] [place] [address] [phone]       Newborn Data: Live born female  Birth Weight: 7 lb 1.4 oz (3215 g) APGAR: 8, 9  Home with mother.  Shervon Kerwin A 05/02/2016, 6:35 AM

## 2016-05-02 NOTE — Lactation Note (Signed)
This note was copied from a baby's chart. Lactation Consultation Note  Experienced BF mother reports that baby tolerated last feeding well.  Parents are aware that another adult needs to be present in the event that the baby has another dusky period and the emergency call bell needs to be activated.  Mother denies any questions for the lactation consultant at this time. Has information on support groups and outpatient services.  Patient Name: Vanessa Sellers ZOXWR'UToday's Date: 05/02/2016     Maternal Data    Feeding Feeding Type: Breast Fed Length of feed: 40 min  LATCH Score/Interventions                      Lactation Tools Discussed/Used     Consult Status      Soyla DryerJoseph, Brycelyn Gambino 05/02/2016, 4:59 PM

## 2016-05-02 NOTE — Anesthesia Postprocedure Evaluation (Signed)
Anesthesia Post Note  Patient: Vanessa Sellers  Procedure(s) Performed: * No procedures listed *  Patient location during evaluation: Mother Baby Anesthesia Type: Epidural Level of consciousness: awake and alert, oriented and patient cooperative Pain management: pain level controlled Vital Signs Assessment: post-procedure vital signs reviewed and stable Respiratory status: spontaneous breathing Cardiovascular status: stable Postop Assessment: no headache, epidural receding, patient able to bend at knees and no signs of nausea or vomiting Anesthetic complications: no Comments: Denies pain.     Last Vitals:  Filed Vitals:   05/02/16 0159 05/02/16 0500  BP: 113/60 114/63  Pulse: 68 63  Temp: 36.7 C 36.7 C  Resp: 18 18    Last Pain:  Filed Vitals:   05/02/16 0526  PainSc: 0-No pain   Pain Goal: Patients Stated Pain Goal: 0 (04/30/16 2122)               Merrilyn PumaWRINKLE,[name]

## 2016-05-02 NOTE — Progress Notes (Signed)
Patient is eating, ambulating, voiding.  Pain control is good.  Filed Vitals:   05/01/16 2044 05/01/16 2150 05/02/16 0159 05/02/16 0500  BP: 125/69 134/75 113/60 114/63  Pulse: 85 89 68 63  Temp: 99 F (37.2 C) 98.3 F (36.8 C) 98 F (36.7 C) 98 F (36.7 C)  TempSrc: Oral Oral Oral Oral  Resp: 18 18 18 18   Height:      Weight:      SpO2: 97% 98% 98%     Fundus firm Perineum without swelling.  Lab Results  Component Value Date   WBC 16.2* 05/02/2016   HGB 10.5* 05/02/2016   HCT 30.4* 05/02/2016   MCV 86.6 05/02/2016   PLT 157 05/02/2016    --/--/B POS, B POS (06/23 2240)/RI  Sellers/P Post partum day 1.  Routine care.  Expect d/c routine.    Vanessa Sellers

## 2016-05-03 MED ORDER — IBUPROFEN 600 MG PO TABS: 600.0000 mg | ORAL_TABLET | Freq: Four times a day (QID) | ORAL | Status: AC | PRN

## 2016-05-03 MED ORDER — DOCUSATE SODIUM 100 MG PO CAPS: 100.0000 mg | ORAL_CAPSULE | Freq: Two times a day (BID) | ORAL | Status: DC

## 2016-05-03 MED ORDER — OXYCODONE-ACETAMINOPHEN 5-325 MG PO TABS: 1.0000 | ORAL_TABLET | ORAL | Status: DC | PRN

## 2016-05-03 NOTE — Lactation Note (Signed)
This note was copied from a baby's chart. Lactation Consultation Note Minimal assist with postioning. Mother denies any questions.  Patient Name: Vanessa Sellers WGNFA'OToday's Date: 05/03/2016 Reason for consult: Follow-up assessment   Maternal Data    Feeding Feeding Type: Breast Fed Length of feed: 20 min  LATCH Score/Interventions Latch: Grasps breast easily, tongue down, lips flanged, rhythmical sucking.  Audible Swallowing:  (end of feeding, non nutritive)  Type of Nipple: Everted at rest and after stimulation  Comfort (Breast/Nipple): Soft / non-tender     Hold (Positioning): Assistance needed to correctly position infant at breast and maintain latch.     Lactation Tools Discussed/Used     Consult Status      Vanessa Sellers, Vanessa Sellers 05/03/2016, 11:02 AM

## 2016-05-08 DIAGNOSIS — R17 Unspecified jaundice: Secondary | ICD-10-CM | POA: Diagnosis not present

## 2016-06-01 DIAGNOSIS — Z124 Encounter for screening for malignant neoplasm of cervix: Secondary | ICD-10-CM | POA: Diagnosis not present

## 2016-07-16 DIAGNOSIS — Z6827 Body mass index (BMI) 27.0-27.9, adult: Secondary | ICD-10-CM | POA: Diagnosis not present

## 2016-07-16 DIAGNOSIS — Z30430 Encounter for insertion of intrauterine contraceptive device: Secondary | ICD-10-CM | POA: Diagnosis not present

## 2016-07-16 DIAGNOSIS — Z3202 Encounter for pregnancy test, result negative: Secondary | ICD-10-CM | POA: Diagnosis not present

## 2016-07-27 DIAGNOSIS — S39012A Strain of muscle, fascia and tendon of lower back, initial encounter: Secondary | ICD-10-CM | POA: Diagnosis not present

## 2016-07-27 DIAGNOSIS — M62830 Muscle spasm of back: Secondary | ICD-10-CM | POA: Diagnosis not present

## 2016-07-27 DIAGNOSIS — S3982XA Other specified injuries of lower back, initial encounter: Secondary | ICD-10-CM | POA: Diagnosis not present

## 2016-08-12 DIAGNOSIS — H1031 Unspecified acute conjunctivitis, right eye: Secondary | ICD-10-CM | POA: Diagnosis not present

## 2016-08-16 DIAGNOSIS — H1031 Unspecified acute conjunctivitis, right eye: Secondary | ICD-10-CM | POA: Diagnosis not present

## 2016-08-24 DIAGNOSIS — Z3482 Encounter for supervision of other normal pregnancy, second trimester: Secondary | ICD-10-CM | POA: Diagnosis not present

## 2016-08-24 DIAGNOSIS — Z3483 Encounter for supervision of other normal pregnancy, third trimester: Secondary | ICD-10-CM | POA: Diagnosis not present

## 2016-09-06 DIAGNOSIS — Z3482 Encounter for supervision of other normal pregnancy, second trimester: Secondary | ICD-10-CM | POA: Diagnosis not present

## 2016-09-06 DIAGNOSIS — Z3483 Encounter for supervision of other normal pregnancy, third trimester: Secondary | ICD-10-CM | POA: Diagnosis not present

## 2016-12-24 DIAGNOSIS — Z0000 Encounter for general adult medical examination without abnormal findings: Secondary | ICD-10-CM | POA: Diagnosis not present

## 2017-01-07 DIAGNOSIS — Z6827 Body mass index (BMI) 27.0-27.9, adult: Secondary | ICD-10-CM | POA: Diagnosis not present

## 2017-01-07 DIAGNOSIS — F329 Major depressive disorder, single episode, unspecified: Secondary | ICD-10-CM | POA: Diagnosis not present

## 2017-02-03 DIAGNOSIS — Z6827 Body mass index (BMI) 27.0-27.9, adult: Secondary | ICD-10-CM | POA: Diagnosis not present

## 2017-02-03 DIAGNOSIS — F329 Major depressive disorder, single episode, unspecified: Secondary | ICD-10-CM | POA: Diagnosis not present

## 2017-05-30 DIAGNOSIS — L858 Other specified epidermal thickening: Secondary | ICD-10-CM | POA: Diagnosis not present

## 2017-05-30 DIAGNOSIS — D229 Melanocytic nevi, unspecified: Secondary | ICD-10-CM | POA: Diagnosis not present

## 2017-05-30 DIAGNOSIS — L409 Psoriasis, unspecified: Secondary | ICD-10-CM | POA: Diagnosis not present

## 2017-06-21 ENCOUNTER — Encounter: Payer: Self-pay | Admitting: Physician Assistant

## 2017-06-21 ENCOUNTER — Ambulatory Visit (INDEPENDENT_AMBULATORY_CARE_PROVIDER_SITE_OTHER): Payer: BLUE CROSS/BLUE SHIELD | Admitting: Physician Assistant

## 2017-06-21 VITALS — BP 116/84 | HR 75 | Temp 98.5°F | Resp 14 | Ht 68.25 in | Wt 185.0 lb

## 2017-06-21 DIAGNOSIS — R590 Localized enlarged lymph nodes: Secondary | ICD-10-CM

## 2017-06-21 DIAGNOSIS — R5383 Other fatigue: Secondary | ICD-10-CM | POA: Diagnosis not present

## 2017-06-21 LAB — CBC WITH DIFFERENTIAL/PLATELET
BASOS ABS: 0 10*3/uL (ref 0.0–0.1)
Basophils Relative: 0.5 % (ref 0.0–3.0)
Eosinophils Absolute: 0.2 10*3/uL (ref 0.0–0.7)
Eosinophils Relative: 2.4 % (ref 0.0–5.0)
HEMATOCRIT: 41.5 % (ref 36.0–46.0)
HEMOGLOBIN: 13.9 g/dL (ref 12.0–15.0)
LYMPHS PCT: 30.3 % (ref 12.0–46.0)
Lymphs Abs: 2.7 10*3/uL (ref 0.7–4.0)
MCHC: 33.6 g/dL (ref 30.0–36.0)
MCV: 91.6 fl (ref 78.0–100.0)
MONOS PCT: 5.6 % (ref 3.0–12.0)
Monocytes Absolute: 0.5 10*3/uL (ref 0.1–1.0)
NEUTROS ABS: 5.4 10*3/uL (ref 1.4–7.7)
Neutrophils Relative %: 61.2 % (ref 43.0–77.0)
Platelets: 303 10*3/uL (ref 150.0–400.0)
RBC: 4.53 Mil/uL (ref 3.87–5.11)
RDW: 13 % (ref 11.5–15.5)
WBC: 8.8 10*3/uL (ref 4.0–10.5)

## 2017-06-21 LAB — VITAMIN B12: VITAMIN B 12: 225 pg/mL (ref 211–911)

## 2017-06-21 LAB — VITAMIN D 25 HYDROXY (VIT D DEFICIENCY, FRACTURES): VITD: 32.93 ng/mL (ref 30.00–100.00)

## 2017-06-21 LAB — TSH: TSH: 1.57 u[IU]/mL (ref 0.35–4.50)

## 2017-06-21 NOTE — Patient Instructions (Signed)
Please go to the lab.  I will call with your results.  If abnormal, we will treat accordingly. If normal, recommend assessment with ENT giving ear findings. I will call you personally so we can discuss next steps.

## 2017-06-21 NOTE — Progress Notes (Signed)
Pre visit review using our clinic review tool, if applicable. No additional management support is needed unless otherwise documented below in the visit note. 

## 2017-06-21 NOTE — Progress Notes (Signed)
Patient presents to clinic today to establish care.  Acute Concerns: Patient endorses long-standing fatigue. Worsened since giving birth. Denies history of anemia or vitamin deficiency. Does note dry skin and weight gain. Denies cld intolerance or constipation. + family history of hypothyroidism in father and brother. Patient also noting 6 month history of intermittent swelling of lymph nodes behind ears bilaterally. Notes significant history of AOM and eustachian tube dysfunction with multiple tubes placed previously.  Denies fever, chills, sweats.   Chronic Issues: Depression  - Postpartum depression and irritability. Currently on a regimen of Fluoxetine 20 mg daily. Is taking as directed. Notes improvement in mood overall. Denies SI/H. Denies side effect of medication. Is managed by Dr. Henderson CloudHorvath.   Patient has IUD in place. Followed by GYN.  Health Maintenance: Immunizations -- up-to-date PAP -- up-to-date per patient. Will need records.  Past Medical History:  Diagnosis Date  . Allergy    Seasonal  . History of chickenpox   . Medical history non-contributory     Past Surgical History:  Procedure Laterality Date  . NO PAST SURGERIES    . TONSILLECTOMY AND ADENOIDECTOMY  1987    Current Outpatient Prescriptions on File Prior to Visit  Medication Sig Dispense Refill  . ibuprofen (ADVIL,MOTRIN) 600 MG tablet Take 1 tablet (600 mg total) by mouth every 6 (six) hours as needed. 90 tablet 0   No current facility-administered medications on file prior to visit.     Allergies  Allergen Reactions  . Bee Venom Anaphylaxis    Family History  Problem Relation Age of Onset  . Healthy Mother   . Hypertension Father   . Hypertension Brother   . Hypertension Maternal Uncle   . Hyperlipidemia Maternal Uncle   . Heart attack Maternal Uncle 48  . COPD Maternal Grandmother   . Heart disease Maternal Grandmother   . Hyperlipidemia Maternal Grandfather   . Hypertension Maternal  Grandfather   . Diabetes Maternal Grandfather   . Heart disease Maternal Grandfather   . Diabetes Paternal Grandmother   . Hyperlipidemia Paternal Grandfather   . Hypertension Paternal Grandfather   . Cancer Paternal Grandfather        Lung  . COPD Paternal Grandfather   . Diabetes Paternal Grandfather   . Heart disease Paternal Grandfather     Social History   Social History  . Marital status: Married    Spouse name: N/A  . Number of children: N/A  . Years of education: N/A   Occupational History  . Not on file.   Social History Main Topics  . Smoking status: Never Smoker  . Smokeless tobacco: Never Used  . Alcohol use Yes     Comment: 2 beers per week  . Drug use: No  . Sexual activity: Yes    Birth control/ protection: IUD     Comment: Mirena   Other Topics Concern  . Not on file   Social History Narrative  . No narrative on file   Review of Systems  Constitutional: Positive for malaise/fatigue. Negative for fever and weight loss.  HENT: Negative for ear discharge, ear pain, hearing loss and tinnitus.   Eyes: Negative for blurred vision, double vision, photophobia and pain.  Respiratory: Negative for cough and shortness of breath.   Cardiovascular: Negative for chest pain and palpitations.  Gastrointestinal: Negative for abdominal pain, blood in stool, constipation, diarrhea, heartburn, melena, nausea and vomiting.  Genitourinary: Negative for dysuria, flank pain, frequency, hematuria and urgency.  Musculoskeletal: Negative  for falls.  Skin:       + dry skin  Neurological: Negative for dizziness, loss of consciousness and headaches.  Endo/Heme/Allergies: Negative for environmental allergies.  Psychiatric/Behavioral: Negative for depression, hallucinations, substance abuse and suicidal ideas. The patient is nervous/anxious. The patient does not have insomnia.    BP 116/84   Pulse 75   Temp 98.5 F (36.9 C) (Oral)   Resp 14   Ht 5' 8.25" (1.734 m)   Wt  185 lb (83.9 kg)   SpO2 98%   BMI 27.92 kg/m   Physical Exam  Constitutional: She is oriented to person, place, and time and well-developed, well-nourished, and in no distress.  HENT:  Head: Normocephalic and atraumatic.  Right Ear: Tympanic membrane is retracted.  Left Ear: Tympanic membrane is retracted.  Nose: No mucosal edema or rhinorrhea. Right sinus exhibits no maxillary sinus tenderness and no frontal sinus tenderness. Left sinus exhibits no maxillary sinus tenderness and no frontal sinus tenderness.  Eyes: Conjunctivae are normal.  Neck: Neck supple. No thyromegaly present.  Cardiovascular: Normal rate, regular rhythm, normal heart sounds and intact distal pulses.   Pulmonary/Chest: Effort normal and breath sounds normal. No respiratory distress. She has no wheezes. She has no rales. She exhibits no tenderness.  Abdominal: Soft. Bowel sounds are normal. She exhibits no distension. There is no tenderness.  Lymphadenopathy:       Head (right side): Posterior auricular and occipital adenopathy present. No submental, no submandibular, no tonsillar and no preauricular adenopathy present.       Head (left side): No submental, no submandibular, no tonsillar, no preauricular, no posterior auricular and no occipital adenopathy present.       Right cervical: No superficial cervical, no deep cervical and no posterior cervical adenopathy present.      Left cervical: No superficial cervical, no deep cervical and no posterior cervical adenopathy present.    She has no axillary adenopathy.       Right: No supraclavicular and no epitrochlear adenopathy present.       Left: No supraclavicular and no epitrochlear adenopathy present.  Neurological: She is alert and oriented to person, place, and time.  Skin: Skin is warm and dry. No rash noted.  Psychiatric: Affect normal.  Vitals reviewed.   Recent Results (from the past 2160 hour(s))  CBC w/Diff     Status: None   Collection Time: 06/21/17   4:17 PM  Result Value Ref Range   WBC 8.8 4.0 - 10.5 K/uL   RBC 4.53 3.87 - 5.11 Mil/uL   Hemoglobin 13.9 12.0 - 15.0 g/dL   HCT 16.1 09.6 - 04.5 %   MCV 91.6 78.0 - 100.0 fl   MCHC 33.6 30.0 - 36.0 g/dL   RDW 40.9 81.1 - 91.4 %   Platelets 303.0 150.0 - 400.0 K/uL   Neutrophils Relative % 61.2 43.0 - 77.0 %   Lymphocytes Relative 30.3 12.0 - 46.0 %   Monocytes Relative 5.6 3.0 - 12.0 %   Eosinophils Relative 2.4 0.0 - 5.0 %   Basophils Relative 0.5 0.0 - 3.0 %   Neutro Abs 5.4 1.4 - 7.7 K/uL   Lymphs Abs 2.7 0.7 - 4.0 K/uL   Monocytes Absolute 0.5 0.1 - 1.0 K/uL   Eosinophils Absolute 0.2 0.0 - 0.7 K/uL   Basophils Absolute 0.0 0.0 - 0.1 K/uL  TSH     Status: None   Collection Time: 06/21/17  4:17 PM  Result Value Ref Range   TSH  1.57 0.35 - 4.50 uIU/mL  Vitamin D (25 hydroxy)     Status: None   Collection Time: 06/21/17  4:17 PM  Result Value Ref Range   VITD 32.93 30.00 - 100.00 ng/mL  B12     Status: None   Collection Time: 06/21/17  4:17 PM  Result Value Ref Range   Vitamin B-12 225 211 - 911 pg/mL    Assessment/Plan: 1. Other fatigue Unclear etiology. Likely multifactorial. Will check lab panel today to further assess. Supportive measures discussed.  - CBC w/Diff - TSH - Vitamin D (25 hydroxy) - B12  2. Postauricular lymphadenopathy R-sided. Significant retraction of TM bilaterally without sign of current infection. Will check CBC w diff. If negative, will need imaging or referral to ENT for further assessment.  - CBC w/Diff   Piedad Climes, PA-C

## 2017-06-22 ENCOUNTER — Other Ambulatory Visit (INDEPENDENT_AMBULATORY_CARE_PROVIDER_SITE_OTHER): Payer: BLUE CROSS/BLUE SHIELD

## 2017-06-22 ENCOUNTER — Other Ambulatory Visit: Payer: Self-pay | Admitting: *Deleted

## 2017-06-22 DIAGNOSIS — H6690 Otitis media, unspecified, unspecified ear: Secondary | ICD-10-CM

## 2017-06-22 DIAGNOSIS — R5383 Other fatigue: Secondary | ICD-10-CM | POA: Diagnosis not present

## 2017-06-22 LAB — SEDIMENTATION RATE: SED RATE: 12 mm/h (ref 0–20)

## 2017-08-02 IMAGING — US US MFM OB LIMITED
1 series · 15 of 23 positions shown · non-contrast
Comparison: none

[Series 1: us mfm ob limited · 15 of 23 slices shown]
[im 1/23]
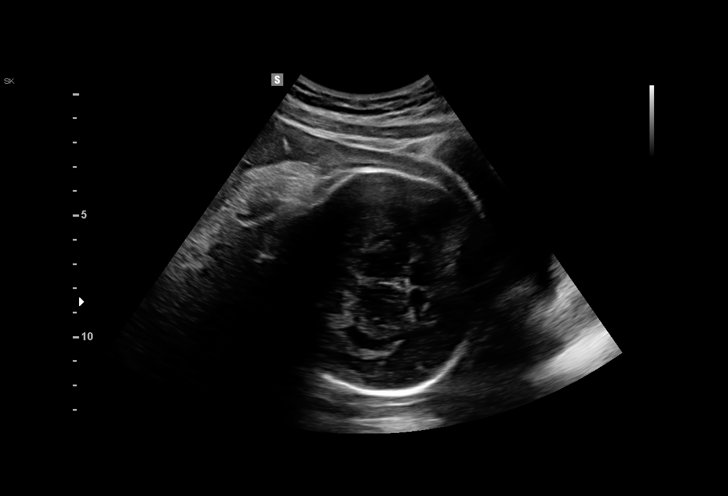
[im 3/23]
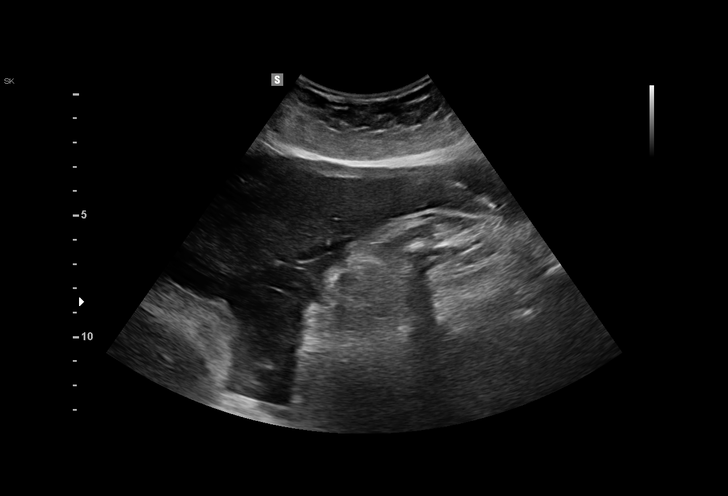
[im 4/23]
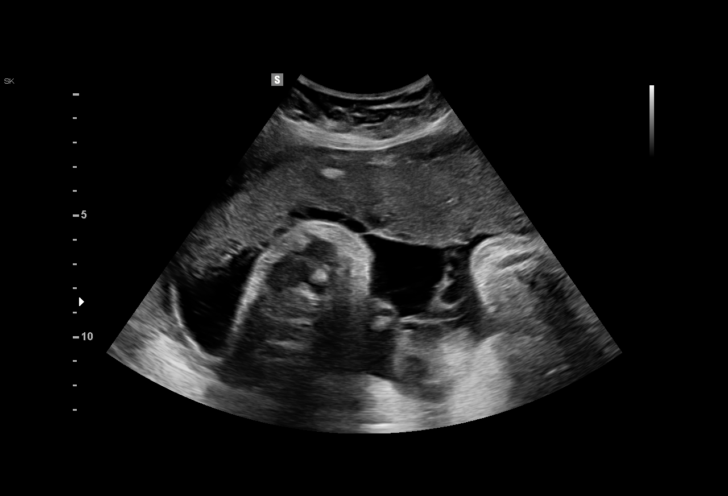
[im 6/23]
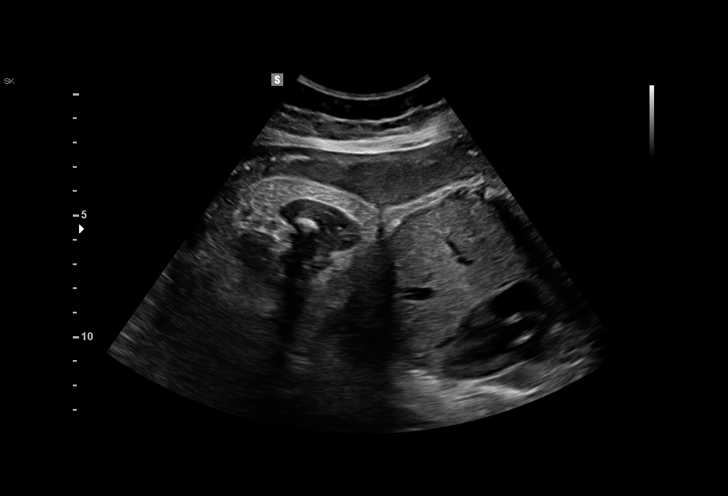
[im 7/23]
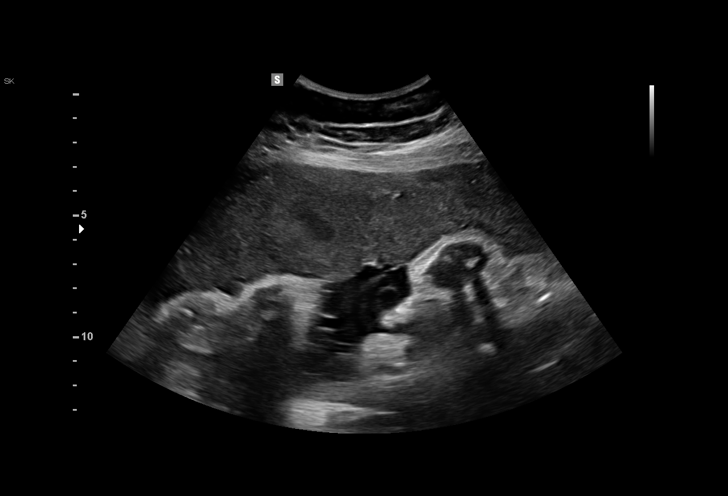
[im 9/23]
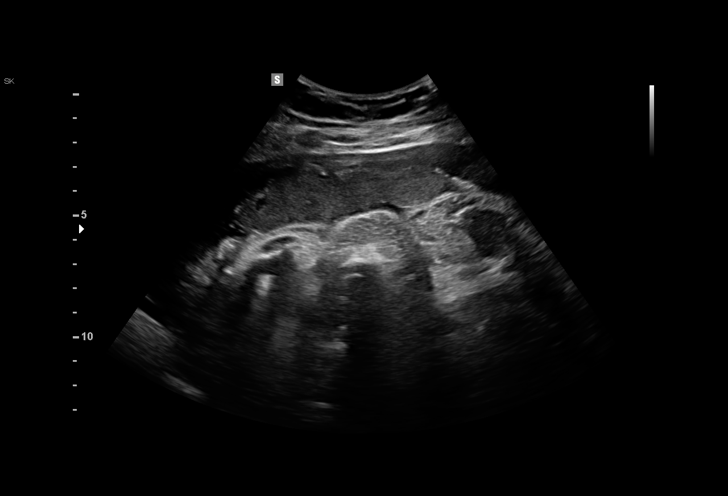
[im 10/23]
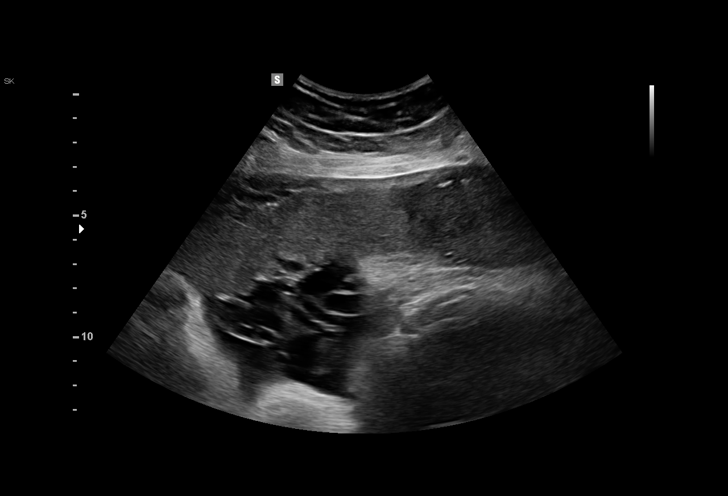
[im 12/23]
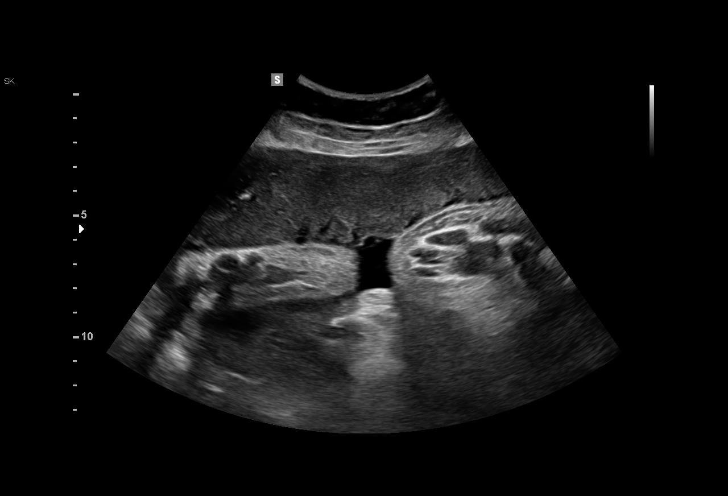
[im 14/23]
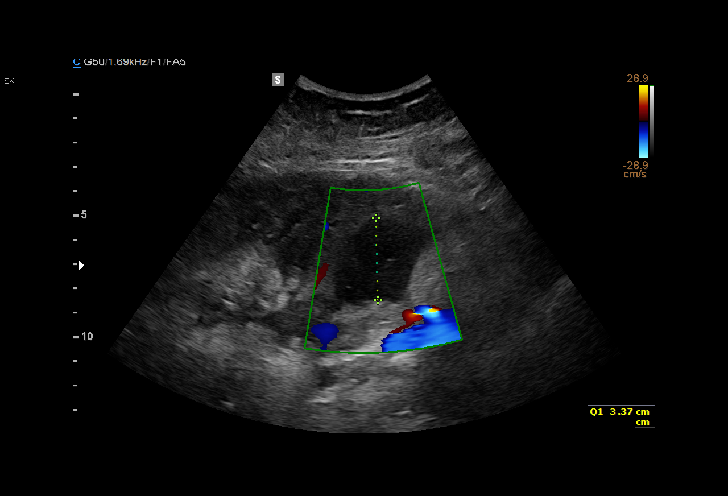
[im 15/23]
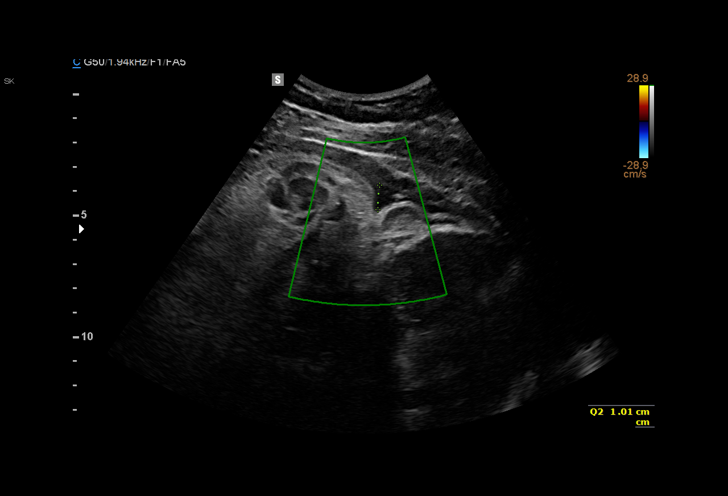
[im 17/23]
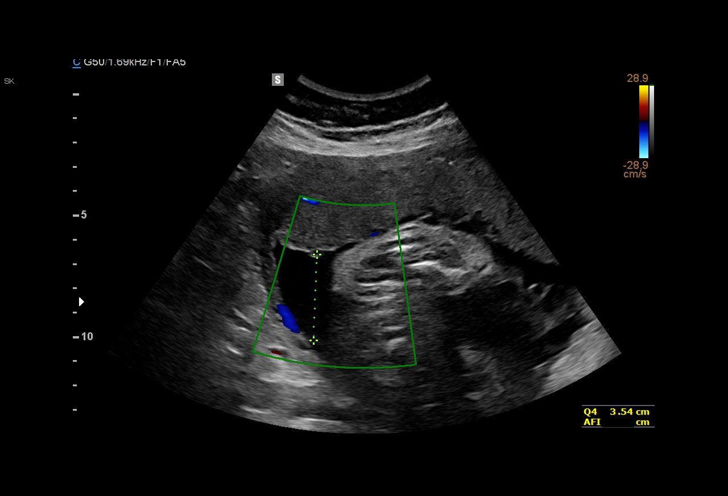
[im 18/23]
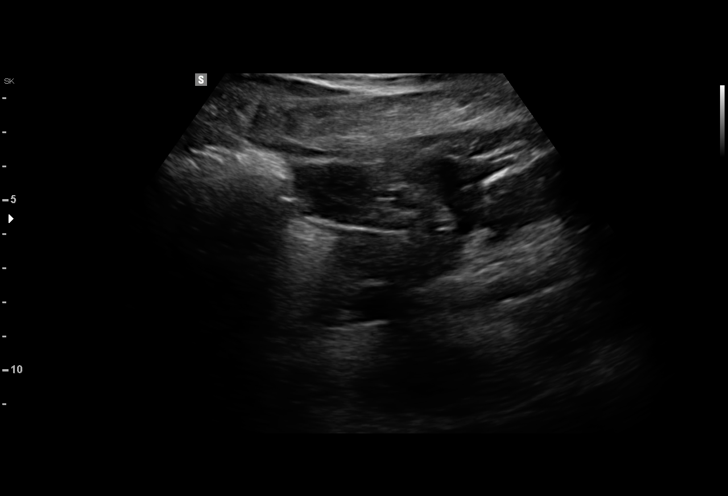
[im 20/23]
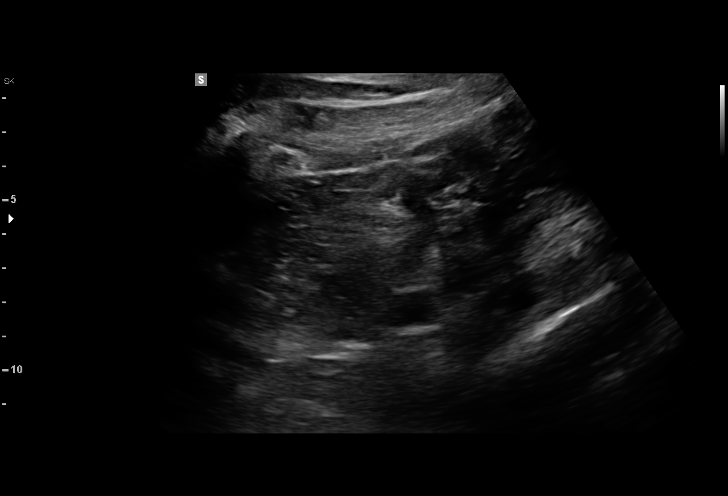
[im 21/23]
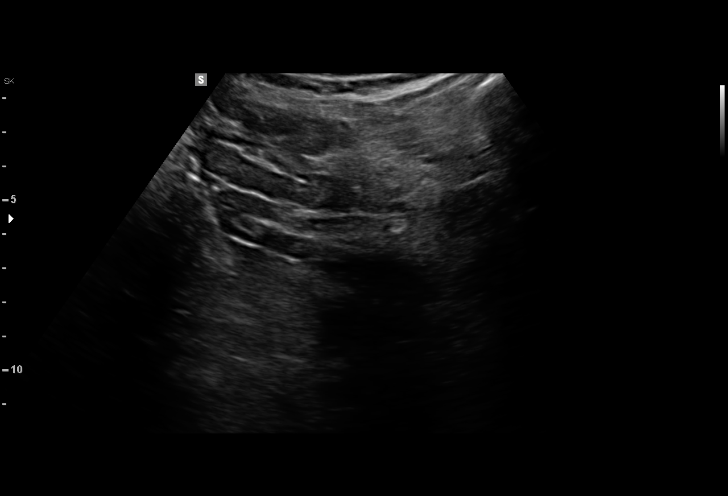
[im 23/23]
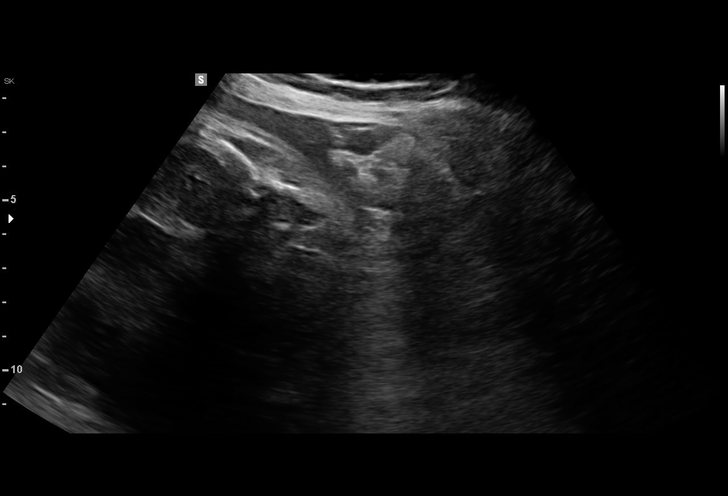

[15 of 23 positions shown; findings below may reference images not displayed]

MAU/Triage

1  RHOEL KELEMEN         [id number]      [id number]     [id number]
Indications

36 weeks gestation of pregnancy
Premature rupture of membranes - leaking
fluid
Determine fetal presentation using ultrasound  Z36
OB History

Gravidity:    4         Term:   2         SAB:   1
Living:       2
Fetal Evaluation

Num Of Fetuses:     1
Fetal Heart         131
Rate(bpm):
Cardiac Activity:   Observed
Presentation:       Cephalic
Placenta:           Anterior, above cervical os

Amniotic Fluid
AFI FV:      Subjectively within normal limits

AFI Sum(cm)     %Tile       Largest Pocket(cm)
12.83           45

RUQ(cm)       RLQ(cm)       LUQ(cm)        LLQ(cm)
3.37

Comment:    No placental abruption or previa identified.
Gestational Age

Clinical EDD:  36w 6d                                        EDD:    05/22/16
Best:          36w 6d     Det. By:  Clinical EDD             EDD:    05/22/16
Cervix Uterus Adnexa

Cervix
Not visualized (advanced GA >08wks)

Left Ovary
Not visualized.

Right Ovary
Not visualized.
Impression

SIUP at 36+6 weeks
Cephalic presentation
Normal amniotic fluid volume
Recommendations

Follow-up as clinically indicated

## 2017-08-10 DIAGNOSIS — Z124 Encounter for screening for malignant neoplasm of cervix: Secondary | ICD-10-CM | POA: Diagnosis not present

## 2017-08-10 DIAGNOSIS — Z6827 Body mass index (BMI) 27.0-27.9, adult: Secondary | ICD-10-CM | POA: Diagnosis not present

## 2017-08-10 DIAGNOSIS — N760 Acute vaginitis: Secondary | ICD-10-CM | POA: Diagnosis not present

## 2017-08-10 DIAGNOSIS — Z01419 Encounter for gynecological examination (general) (routine) without abnormal findings: Secondary | ICD-10-CM | POA: Diagnosis not present

## 2017-08-10 DIAGNOSIS — R61 Generalized hyperhidrosis: Secondary | ICD-10-CM | POA: Diagnosis not present

## 2017-08-11 ENCOUNTER — Encounter: Payer: Self-pay | Admitting: Emergency Medicine

## 2017-12-28 ENCOUNTER — Encounter: Payer: Self-pay | Admitting: Emergency Medicine

## 2018-04-17 DIAGNOSIS — Z713 Dietary counseling and surveillance: Secondary | ICD-10-CM | POA: Diagnosis not present

## 2018-10-19 ENCOUNTER — Encounter: Payer: Self-pay | Admitting: Emergency Medicine

## 2018-10-19 DIAGNOSIS — Z124 Encounter for screening for malignant neoplasm of cervix: Secondary | ICD-10-CM | POA: Diagnosis not present

## 2018-10-19 DIAGNOSIS — Z01419 Encounter for gynecological examination (general) (routine) without abnormal findings: Secondary | ICD-10-CM | POA: Diagnosis not present

## 2018-10-19 DIAGNOSIS — Z6827 Body mass index (BMI) 27.0-27.9, adult: Secondary | ICD-10-CM | POA: Diagnosis not present

## 2018-10-19 LAB — HM PAP SMEAR: HM Pap smear: NORMAL

## 2018-12-10 DIAGNOSIS — J029 Acute pharyngitis, unspecified: Secondary | ICD-10-CM | POA: Diagnosis not present

## 2018-12-10 DIAGNOSIS — J09X2 Influenza due to identified novel influenza A virus with other respiratory manifestations: Secondary | ICD-10-CM | POA: Diagnosis not present

## 2018-12-10 DIAGNOSIS — R5383 Other fatigue: Secondary | ICD-10-CM | POA: Diagnosis not present

## 2018-12-10 DIAGNOSIS — Z20828 Contact with and (suspected) exposure to other viral communicable diseases: Secondary | ICD-10-CM | POA: Diagnosis not present

## 2019-03-28 DIAGNOSIS — N912 Amenorrhea, unspecified: Secondary | ICD-10-CM | POA: Diagnosis not present

## 2019-04-30 DIAGNOSIS — Z20828 Contact with and (suspected) exposure to other viral communicable diseases: Secondary | ICD-10-CM | POA: Diagnosis not present

## 2019-08-20 ENCOUNTER — Encounter (HOSPITAL_COMMUNITY): Payer: Self-pay

## 2019-08-20 ENCOUNTER — Emergency Department (HOSPITAL_COMMUNITY): Payer: BC Managed Care – PPO

## 2019-08-20 ENCOUNTER — Other Ambulatory Visit: Payer: Self-pay

## 2019-08-20 ENCOUNTER — Emergency Department (HOSPITAL_COMMUNITY)
Admission: EM | Admit: 2019-08-20 | Discharge: 2019-08-20 | Disposition: A | Discharge status: Home / Self Care | Payer: BC Managed Care – PPO | Attending: Emergency Medicine | Admitting: Emergency Medicine

## 2019-08-20 DIAGNOSIS — Y999 Unspecified external cause status: Secondary | ICD-10-CM | POA: Insufficient documentation

## 2019-08-20 DIAGNOSIS — S060X9A Concussion with loss of consciousness of unspecified duration, initial encounter: Secondary | ICD-10-CM | POA: Diagnosis not present

## 2019-08-20 DIAGNOSIS — S199XXA Unspecified injury of neck, initial encounter: Secondary | ICD-10-CM | POA: Diagnosis not present

## 2019-08-20 DIAGNOSIS — S060X0A Concussion without loss of consciousness, initial encounter: Secondary | ICD-10-CM | POA: Insufficient documentation

## 2019-08-20 DIAGNOSIS — R112 Nausea with vomiting, unspecified: Secondary | ICD-10-CM | POA: Diagnosis not present

## 2019-08-20 DIAGNOSIS — Y929 Unspecified place or not applicable: Secondary | ICD-10-CM | POA: Insufficient documentation

## 2019-08-20 DIAGNOSIS — Z79899 Other long term (current) drug therapy: Secondary | ICD-10-CM | POA: Diagnosis not present

## 2019-08-20 DIAGNOSIS — M542 Cervicalgia: Secondary | ICD-10-CM | POA: Diagnosis not present

## 2019-08-20 DIAGNOSIS — Y939 Activity, unspecified: Secondary | ICD-10-CM | POA: Insufficient documentation

## 2019-08-20 DIAGNOSIS — S0990XA Unspecified injury of head, initial encounter: Secondary | ICD-10-CM | POA: Diagnosis not present

## 2019-08-20 NOTE — ED Notes (Signed)
Got patient into a gown on the monitor patient is resting with call bell in reach 

## 2019-08-20 NOTE — ED Notes (Signed)
Patient verbalizes understanding of discharge instructions. Opportunity for questioning and answers were provided. Armband removed by staff, pt discharged from ED.  

## 2019-08-20 NOTE — ED Provider Notes (Signed)
[facility]ENC[facility] Provider Note   CSN: [id number] Arrival date & time: 08/20/19  1127     History   Chief Complaint Chief Complaint  Patient presents with   Head Injury   Nausea    HPI Vanessa Sellers is a 38 y.o. female.     Vanessa Sellers is a 38 y.o. female who is otherwise healthy, presents to the ED for evaluation of head injury.  Patient reports that she was riding on a hover board when she tried to spin around to miss her nephew who walked in front of her.  She reports that she fell backwards striking the back of her head.  She denies loss of consciousness but reports since then she has had a headache which is been worsening today while she has been at work using a [employer] screen.  She also reports some nausea, one episode of vomiting.  She has had some intermittent blurred vision improved today.  Also reports some pain in the back of her neck but denies any numbness tingling or weakness in her extremities.  She also reports some pain over her coccyx, but reports she has broken this multiple times in the past, reports pain is mild she denies any pain in her upper back, no numbness weakness, loss of bowel or bladder control or saddle anesthesia.  She has not taken anything to treat her symptoms prior to arrival but left work due to worsening symptoms.  No other aggravating or alleviating factors.  No recent head injury or concussion.     Past Medical History:  Diagnosis Date   Allergy    Seasonal   History of chickenpox    Medical history non-contributory     Patient Active Problem List   Diagnosis Date Noted   Postpartum state 05/01/2016   Normal labor 04/30/2016    Past Surgical History:  Procedure Laterality Date   NO PAST SURGERIES     TONSILLECTOMY AND ADENOIDECTOMY  1987     OB History    Gravida  4   Para  3   Term  3   Preterm      AB  1   Living  3     SAB  1   TAB      Ectopic      Multiple  0   Live Births  3            Home Medications    Prior to Admission medications   Medication Sig Start Date End Date Taking? Authorizing Provider  FLUoxetine (PROZAC) 20 MG capsule Take 1 capsule by mouth daily. 04/25/17   [provider]  ibuprofen (ADVIL,MOTRIN) 600 MG tablet Take 1 tablet (600 mg total) by mouth every 6 (six) hours as needed. 05/03/16   Waynard Reedsoss, Kendra, MD  levonorgestrel (MIRENA, 52 MG,) 20 MCG/24HR IUD Mirena 20 mcg/24 hr (5 years) intrauterine device  Take 1 device by intrauterine route.    [provider]  Multiple Vitamins-Minerals (MULTIVITAMIN WOMEN PO) Take 1 tablet by mouth daily.    [provider]    Family History Family History  Problem Relation Age of Onset   Healthy Mother    Hypertension Father    Hypertension Brother    Hypertension Maternal Uncle    Hyperlipidemia Maternal Uncle    Heart attack Maternal Uncle [id number]   COPD Maternal Grandmother    Heart disease Maternal Grandmother    Hyperlipidemia Maternal Grandfather    Hypertension Maternal  Grandfather    Diabetes Maternal Grandfather    Heart disease Maternal Grandfather    Diabetes Paternal Grandmother    Hyperlipidemia Paternal Grandfather    Hypertension Paternal Grandfather    Cancer Paternal Grandfather        Lung   COPD Paternal Grandfather    Diabetes Paternal Grandfather    Heart disease Paternal Grandfather     Social History Social History   Tobacco Use   Smoking status: Never Smoker   Smokeless tobacco: Never Used  Substance Use Topics   Alcohol use: Yes    Comment: 2 beers per week   Drug use: No     Allergies   Bee venom   Review of Systems Review of Systems  Constitutional: Negative for chills and fever.  Gastrointestinal: Positive for nausea and vomiting.  Musculoskeletal: Positive for neck pain.  Skin: Negative for color change and rash.  Neurological: Positive for dizziness and headaches. Negative for  syncope, weakness, light-headedness and numbness.     Physical Exam Updated Vital Signs BP 130/83    Pulse 76    Temp 98.7 F (37.1 C)    Resp 17    SpO2 100%   Physical Exam Vitals signs and nursing note reviewed.  Constitutional:      General: She is not in acute distress.    Appearance: Normal appearance. She is well-developed and normal weight. She is not ill-appearing or diaphoretic.  HENT:     Head: Normocephalic.     Comments: There is some mild tenderness across the back of the head without palpable deformity or step-off, negative battle sign, no CSF otorrhea. Eyes:     General:        Right eye: No discharge.        Left eye: No discharge.     Extraocular Movements: Extraocular movements intact.     Conjunctiva/sclera: Conjunctivae normal.     Pupils: Pupils are equal, round, and reactive to light.  Neck:     Comments: No focal midline C-spine tenderness, normal range of motion in all directions Cardiovascular:     Rate and Rhythm: Normal rate and regular rhythm.     Pulses: Normal pulses.     Heart sounds: Normal heart sounds.  Pulmonary:     Effort: Pulmonary effort is normal. No respiratory distress.     Breath sounds: Normal breath sounds.  Abdominal:     General: Abdomen is flat. Bowel sounds are normal. There is no distension.     Palpations: Abdomen is soft. There is no mass.     Tenderness: There is no abdominal tenderness. There is no guarding.  Musculoskeletal:     Comments: No midline thoracic or lumbar spine tenderness Patient has some tenderness over the lower sacrum without palpable deformity, no ecchymosis. All joints supple and easily movable.  All compartments soft.  Skin:    General: Skin is warm and dry.     Capillary Refill: Capillary refill takes less than 2 seconds.  Neurological:     Mental Status: She is alert.     Coordination: Coordination normal.     Comments: Speech is clear, able to follow commands CN III-XII intact Normal  strength in upper and lower extremities bilaterally including dorsiflexion and plantar flexion, strong and equal grip strength Sensation normal to light and sharp touch Moves extremities without ataxia, coordination intact Normal finger to nose and rapid alternating movements No pronator drift  Psychiatric:        Mood  and Affect: Mood normal.        Behavior: Behavior normal.      ED Treatments / Results  Labs (all labs ordered are listed, but only abnormal results are displayed) Labs Reviewed - No data to display  EKG None  Radiology Ct Head Wo Contrast  Result Date: 08/20/2019 CLINICAL DATA:  Pain following fall from hover board.  Confusion. EXAM: CT HEAD WITHOUT CONTRAST CT CERVICAL SPINE WITHOUT CONTRAST TECHNIQUE: Multidetector CT imaging of the head and cervical spine was performed following the standard protocol without intravenous contrast. Multiplanar CT image reconstructions of the cervical spine were also generated. COMPARISON:  Report of prior cervical spine CT Mar 18, 2001 available; images from that study no longer available. FINDINGS: CT HEAD FINDINGS Brain: Ventricles are normal in size and configuration. There is no intracranial mass, hemorrhage, extra-axial fluid collection, or midline shift. The brain parenchyma appears unremarkable. No acute infarct evident. Vascular: There is no hyperdense vessel. There is no appreciable vascular calcification. Skull: Bony calvarium appears intact. Sinuses/Orbits: There is mild mucosal thickening in occasional ethmoid air cells. Other paranasal sinuses are clear. Orbits appear symmetric bilaterally. Other: Mastoid air cells are clear. CT CERVICAL SPINE FINDINGS Alignment: There is no appreciable spondylolisthesis. Skull base and vertebrae: Skull base and craniocervical junction regions appear normal. There is partial ankylosis at C3-4, apparently congenital. There is no demonstrable fracture. There are no blastic or lytic bone lesions.  Soft tissues and spinal canal: Prevertebral soft tissues and predental space regions are normal. No cord or canal hematoma evident. No paraspinous lesions. Disc levels: There is partial ankylosis at C3-4. There is mild disc space narrowing C6-7 and C7-T1. There is mild facet hypertrophy at several levels. There is no nerve root edema or effacement. No disc extrusion or stenosis. Upper chest: Visualized upper lung regions are clear. Other: None IMPRESSION: CT head: Slight ethmoid sinus disease. Study otherwise unremarkable. CT cervical spine: No evident fracture or spondylolisthesis. 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 Slight osteoarthritic change at several levels. No disc extrusion or stenosis. Electronically Signed   By: Bretta Bang III M.D.   On: 08/20/2019 13:28   Ct Cervical Spine Wo Contrast  Result Date: 08/20/2019 CLINICAL DATA:  Pain following fall from hover board.  Confusion. EXAM: CT HEAD WITHOUT CONTRAST CT CERVICAL SPINE WITHOUT CONTRAST TECHNIQUE: Multidetector CT imaging of the head and cervical spine was performed following the standard protocol without intravenous contrast. Multiplanar CT image reconstructions of the cervical spine were also generated. COMPARISON:  Report of prior cervical spine CT Mar 18, 2001 available; images from that study no longer available. FINDINGS: CT HEAD FINDINGS Brain: Ventricles are normal in size and configuration. There is no intracranial mass, hemorrhage, extra-axial fluid collection, or midline shift. The brain parenchyma appears unremarkable. No acute infarct evident. Vascular: There is no hyperdense vessel. There is no appreciable vascular calcification. Skull: Bony calvarium appears intact. Sinuses/Orbits: There is mild mucosal thickening in occasional ethmoid air cells. Other paranasal sinuses are clear. Orbits appear symmetric bilaterally. Other: Mastoid air cells are clear. CT CERVICAL SPINE FINDINGS Alignment: There is no  appreciable spondylolisthesis. Skull base and vertebrae: Skull base and craniocervical junction regions appear normal. There is partial ankylosis at C3-4, apparently congenital. There is no demonstrable fracture. There are no blastic or lytic bone lesions. Soft tissues and spinal canal: Prevertebral soft tissues and predental space regions are normal. No cord or canal hematoma evident. No paraspinous lesions. Disc levels: There is partial ankylosis at C3-4. There  is mild disc space narrowing C6-7 and C7-T1. There is mild facet hypertrophy at several levels. There is no nerve root edema or effacement. No disc extrusion or stenosis. Upper chest: Visualized upper lung regions are clear. Other: None IMPRESSION: CT head: Slight ethmoid sinus disease. Study otherwise unremarkable. CT cervical spine: No evident fracture or spondylolisthesis. 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 Slight osteoarthritic change at several levels. No disc extrusion or stenosis. Electronically Signed   By: Bretta Bang III M.D.   On: 08/20/2019 13:28    Procedures Procedures (including critical care time)  Medications Ordered in ED Medications - No data to display   Initial Impression / Assessment and Plan / ED Course  I have reviewed the triage vital signs and the nursing notes.  Pertinent labs & imaging results that were available during my care of the patient were reviewed by me and considered in my medical decision making (see chart for details).  38 year old female presents for headache, intermittent dizziness and nausea after sustaining a head injury yesterday.  No loss of consciousness.  On arrival neurologic exam is normal and vitals are stable.  She also had reported some pain in her neck as well as pain over her coccyx.  CT head and cervical spine ordered from triage which are unremarkable and show no acute intracranial abnormality just some ethmoid sinus disease, no fracture of the C-spine, partial  ankylosis at C3-4 which is congenital.  Offered patient imaging of sacrum and coccyx but she declines, reporting that she has broken this multiple times in the past feels similar and she knows there is nothing to do from it aside from sitting on a pillow.  Feel this is reasonable.  Given reassuring imaging and normal neurologic exam suspect mild concussion.  Discussed treating headache with Tylenol as needed.  Encouraged brain rest to help concussion symptoms improve.  Recommended follow-up with concussion clinic if symptoms not improving.  Work excuse provided.  Discharged home in good condition.  Return precautions discussed.  Patient expresses understanding and agreement.   Final Clinical Impressions(s) / ED Diagnoses   Final diagnoses:  Injury of head, initial encounter  Concussion without loss of consciousness, initial encounter    ED Discharge Orders    None       Dartha Lodge, [place] 08/21/19 1551    Arby Barrette, MD 08/29/19 [phone]

## 2019-08-20 NOTE — Discharge Instructions (Signed)
You were examined today for a head injury and possible concussion.  Your head and neck CT are reassuring.  Sometimes serious problems can develop after a head injury. Please return to the emergency department if you experience any of the following symptoms: Repeated vomiting Headache that gets worse and does not go away Loss of consciousness or inability to stay awake at times when you normally would be able to Getting more confused, restless or agitated Convulsions or seizures Difficulty walking or feeling off balance Weakness or numbness Vision changes A concussion is a very mild traumatic brain injury caused by a bump, jolt or blow to the head, most people recover quickly and fully. You can experience a wide variety of symptoms including:   - Confusion      - Difficulty concentrating       - Trouble remembering new info  - Headache      - Dizziness        - Fuzzy or blurry vision  - Fatigue      - Balance problems      - Light sensitivity  - Mood swings     - Changes in sleep or difficulty sleeping   To help these symptoms improve make sure you are getting plenty of rest, avoid screen time, loud music and strenuous mental activities. Avoid any strenuous physical activities, once your symptoms have resolved a slow and gradual return to activity is recommended. It is very important that you avoid situations in which you might sustain a second head injury as this can be very dangerous and life threatening. You cannot be medically cleared to return to normal activities until you have followed up with your primary doctor or a concussion specialist for reevaluation.

## 2019-08-20 NOTE — ED Triage Notes (Signed)
Patient reports fall from [place] board yesterday afternoon with no loc. This am coccyx pain with nausea, vomiting. Reports that she did hit her head yesterday, blurred vision with same. Also complains of neck pain

## 2019-09-18 ENCOUNTER — Telehealth: Payer: Self-pay | Admitting: Physician Assistant

## 2019-09-18 NOTE — Telephone Encounter (Signed)
LM asking pt to call back to let us know if she still plans on using [facility] as her PCP and if so to please schedule a CPE appt with him. ELEA  °

## 2020-01-07 DIAGNOSIS — J029 Acute pharyngitis, unspecified: Secondary | ICD-10-CM | POA: Diagnosis not present

## 2020-03-07 DIAGNOSIS — Z6825 Body mass index (BMI) 25.0-25.9, adult: Secondary | ICD-10-CM | POA: Diagnosis not present

## 2020-03-07 DIAGNOSIS — Z8742 Personal history of other diseases of the female genital tract: Secondary | ICD-10-CM | POA: Diagnosis not present

## 2020-03-07 DIAGNOSIS — Z01419 Encounter for gynecological examination (general) (routine) without abnormal findings: Secondary | ICD-10-CM | POA: Diagnosis not present

## 2020-08-13 DIAGNOSIS — R0981 Nasal congestion: Secondary | ICD-10-CM | POA: Diagnosis not present

## 2020-08-13 DIAGNOSIS — Z03818 Encounter for observation for suspected exposure to other biological agents ruled out: Secondary | ICD-10-CM | POA: Diagnosis not present

## 2020-11-21 IMAGING — CT CT HEAD W/O CM
5 of 8 series · 17 of 47 positions shown, 18 images · non-contrast
Comparison: Report of prior cervical spine CT March 18, 2001
available; images from that study no longer available.

CLINICAL DATA: Pain following fall from hover board.  Confusion.

EXAM:
CT HEAD WITHOUT CONTRAST
CT CERVICAL SPINE WITHOUT CONTRAST
TECHNIQUE: Multidetector CT imaging of the head and cervical spine was
performed following the standard protocol without intravenous
contrast. Multiplanar CT image reconstructions of the cervical spine
were also generated.

[Series 5: head bone · axial · 0.42mm/px · z∈[-65,-13]mm · 2 of 78 slices shown, 3 images]
[im 26/78  brain]
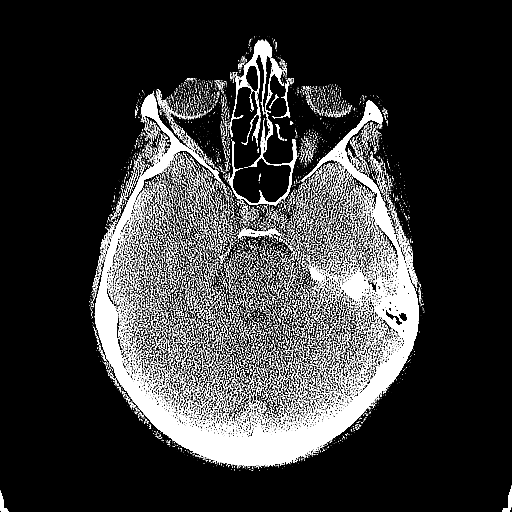
[im 26/78  bone]
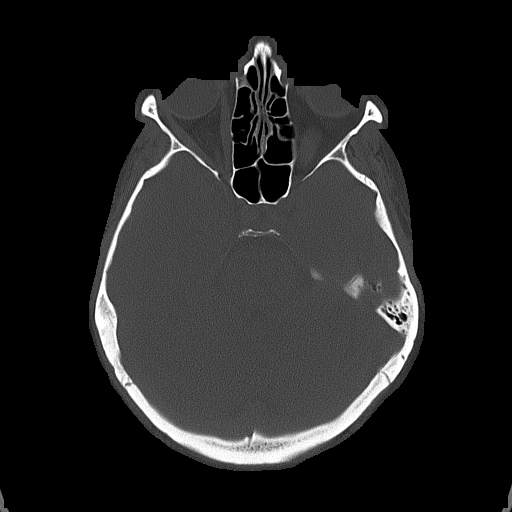
[im 52/78  brain]
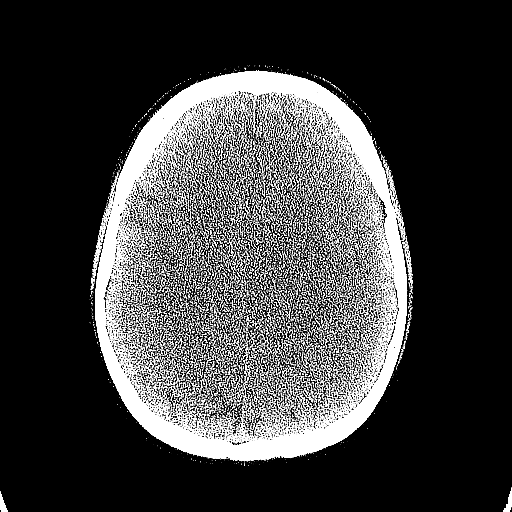

[Series 6: head without cor · coronal · non-contrast · 0.32mm/px · 3 of 67 slices shown]
[im 17/67  brain]
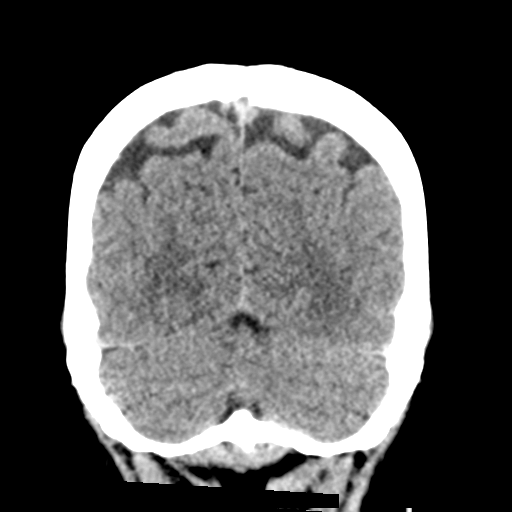
[im 34/67  brain]
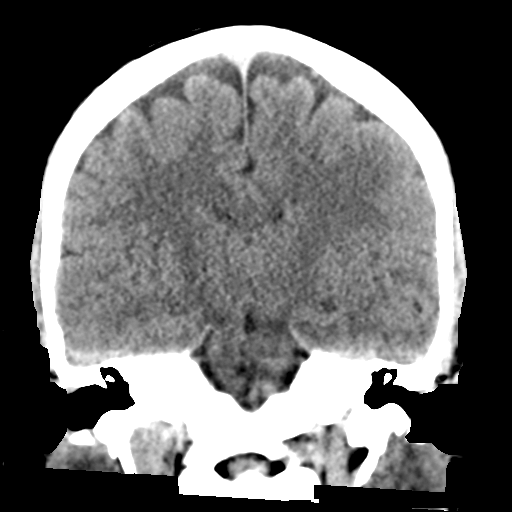
[im 50/67  brain]
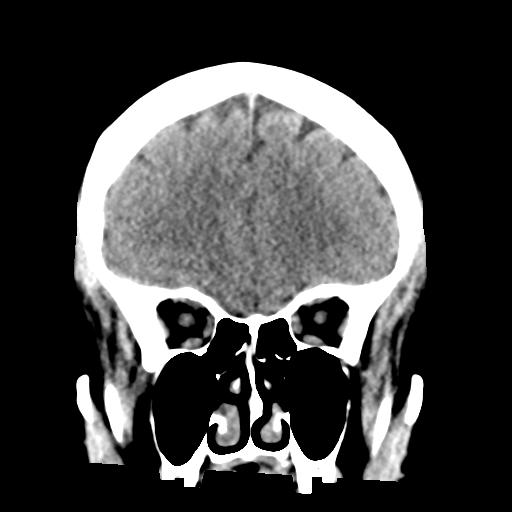

[Series 7: head without sag · sagittal · non-contrast · 0.33mm/px · 2 of 67 slices shown]
[im 23/67  brain]
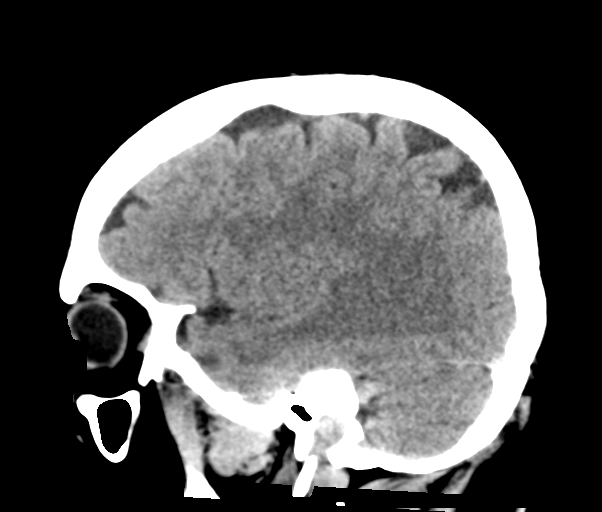
[im 45/67  brain]
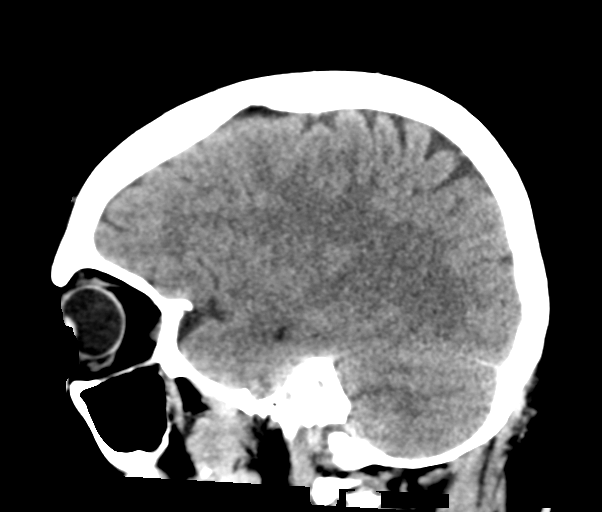

[Series 12: c_spine 2.0 orthogonals · axial · 0.21mm/px · z∈[-293,-260]mm · 2 of 115 slices shown]
[im 20/115  brain]
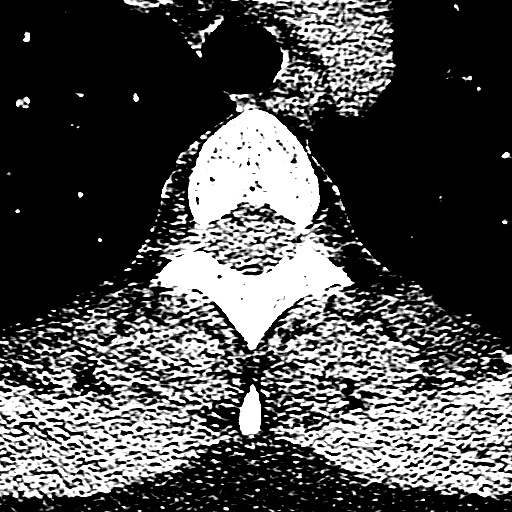
[im 39/115  brain]
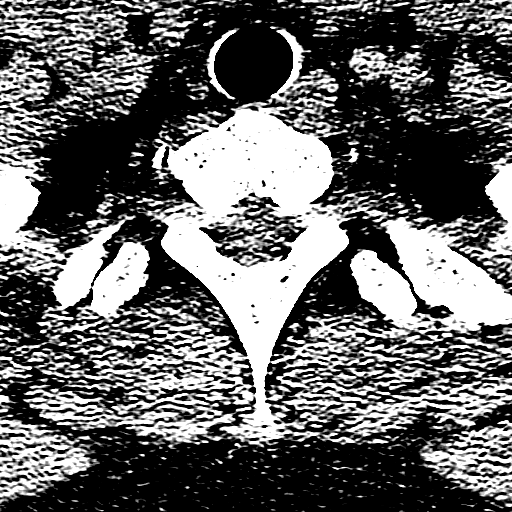

[Series 13: c_spine 1.0 st thins · axial · 0.32mm/px · z∈[-278,-96]mm · 8 of 297 slices shown]
[im 19/297  brain]
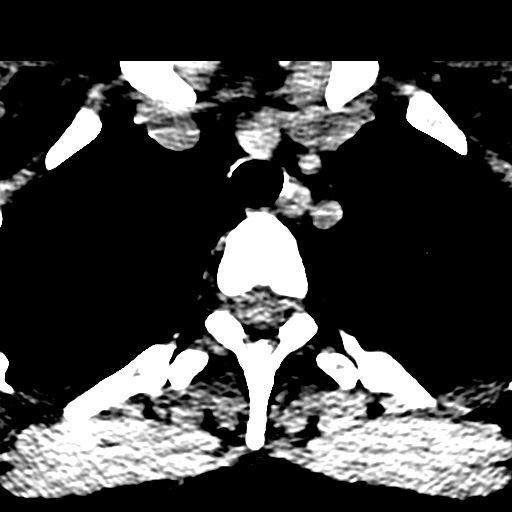
[im 56/297  brain]
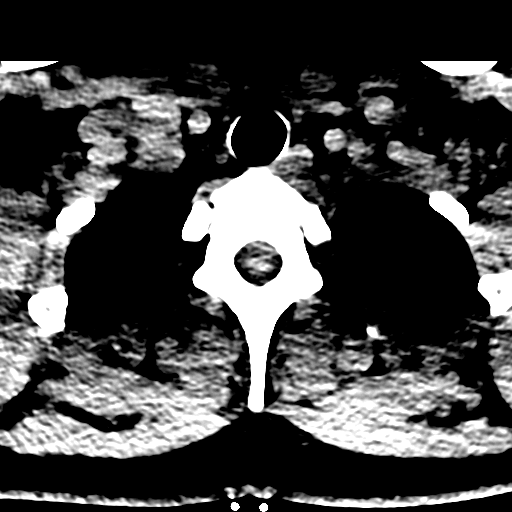
[im 93/297  brain]
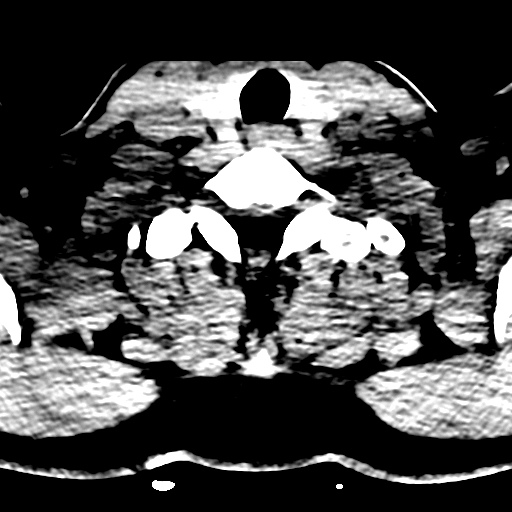
[im 130/297  brain]
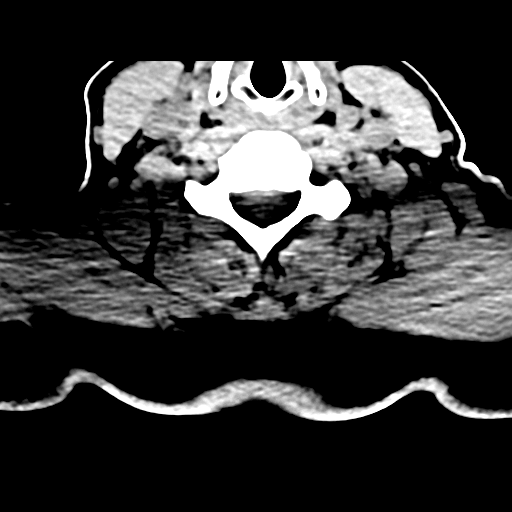
[im 167/297  brain]
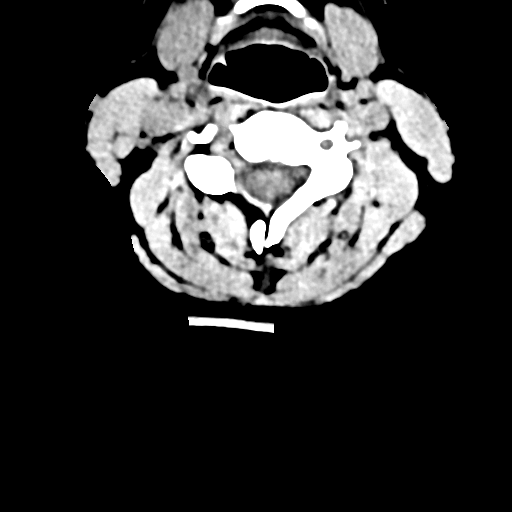
[im 204/297  brain]
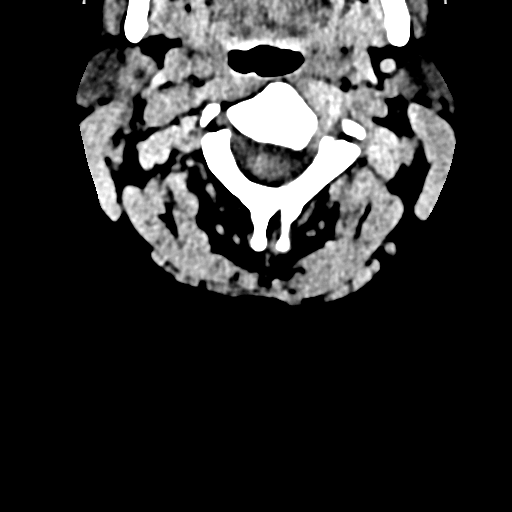
[im 241/297  brain]
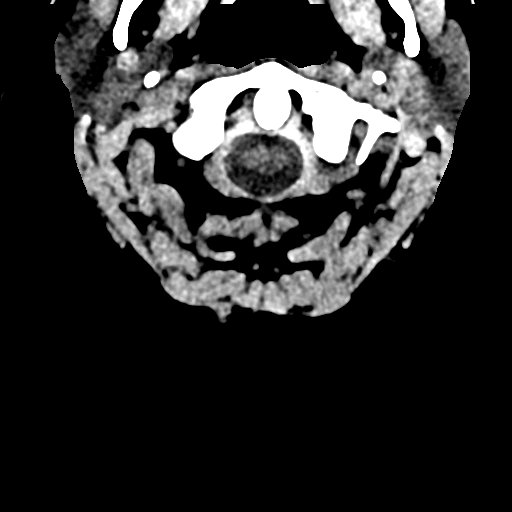
[im 278/297  brain]
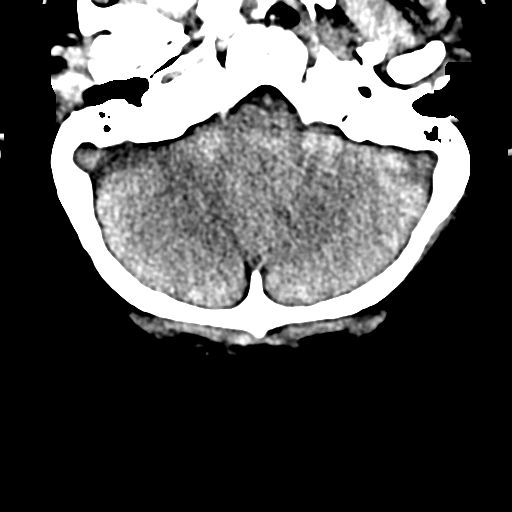

[17 of 47 positions shown; findings below may reference images not displayed]

FINDINGS: CT HEAD FINDINGS

Brain: Ventricles are normal in size and configuration. There is no
intracranial mass, hemorrhage, extra-axial fluid collection, or
midline shift. The brain parenchyma appears unremarkable. No acute
infarct evident.

Vascular: There is no hyperdense vessel. There is no appreciable
vascular calcification.

Skull: Bony calvarium appears intact.

Sinuses/Orbits: There is mild mucosal thickening in occasional
ethmoid air cells. Other paranasal sinuses are clear. Orbits appear
symmetric bilaterally.

Other: Mastoid air cells are clear.

CT CERVICAL SPINE FINDINGS

Alignment: There is no appreciable spondylolisthesis.

Skull base and vertebrae: Skull base and craniocervical junction
regions appear normal. There is partial ankylosis at C3-4,
apparently congenital. There is no demonstrable fracture. There are
no blastic or lytic bone lesions.

Soft tissues and spinal canal: Prevertebral soft tissues and
predental space regions are normal. No cord or canal hematoma
evident. No paraspinous lesions.

Disc levels: There is partial ankylosis at C3-4. There is mild disc
space narrowing C6-7 and C7-T1. There is mild facet hypertrophy at
several levels. There is no nerve root edema or effacement. No disc
extrusion or stenosis.

Upper chest: Visualized upper lung regions are clear.

Other: None
IMPRESSION: CT head: Slight ethmoid sinus disease. Study otherwise unremarkable.

CT cervical spine: No evident fracture or spondylolisthesis. Partial
ankylosis at C3-4, apparently congenital. Slight osteoarthritic
change at several levels. No disc extrusion or stenosis.

## 2021-08-04 DIAGNOSIS — Z124 Encounter for screening for malignant neoplasm of cervix: Secondary | ICD-10-CM | POA: Diagnosis not present

## 2021-08-04 DIAGNOSIS — Z6823 Body mass index (BMI) 23.0-23.9, adult: Secondary | ICD-10-CM | POA: Diagnosis not present

## 2021-08-04 DIAGNOSIS — Z01419 Encounter for gynecological examination (general) (routine) without abnormal findings: Secondary | ICD-10-CM | POA: Diagnosis not present

## 2021-09-16 DIAGNOSIS — H66003 Acute suppurative otitis media without spontaneous rupture of ear drum, bilateral: Secondary | ICD-10-CM | POA: Diagnosis not present

## 2021-10-06 ENCOUNTER — Ambulatory Visit: Payer: BC Managed Care – PPO | Admitting: Registered Nurse

## 2021-10-09 ENCOUNTER — Ambulatory Visit: Payer: BC Managed Care – PPO | Admitting: Registered Nurse

## 2021-12-07 ENCOUNTER — Ambulatory Visit: Payer: Self-pay | Admitting: Registered Nurse

## 2021-12-11 ENCOUNTER — Other Ambulatory Visit: Payer: Self-pay

## 2021-12-11 ENCOUNTER — Ambulatory Visit (INDEPENDENT_AMBULATORY_CARE_PROVIDER_SITE_OTHER): Payer: BC Managed Care – PPO | Admitting: Registered Nurse

## 2021-12-11 ENCOUNTER — Encounter: Payer: Self-pay | Admitting: Registered Nurse

## 2021-12-11 ENCOUNTER — Other Ambulatory Visit: Payer: Self-pay | Admitting: Registered Nurse

## 2021-12-11 VITALS — BP 117/76 | HR 71 | Temp 98.2°F | Resp 17 | Ht 68.26 in | Wt 157.6 lb

## 2021-12-11 DIAGNOSIS — Z1329 Encounter for screening for other suspected endocrine disorder: Secondary | ICD-10-CM

## 2021-12-11 DIAGNOSIS — K648 Other hemorrhoids: Secondary | ICD-10-CM

## 2021-12-11 DIAGNOSIS — Z87892 Personal history of anaphylaxis: Secondary | ICD-10-CM

## 2021-12-11 DIAGNOSIS — Z13220 Encounter for screening for lipoid disorders: Secondary | ICD-10-CM | POA: Diagnosis not present

## 2021-12-11 DIAGNOSIS — R062 Wheezing: Secondary | ICD-10-CM

## 2021-12-11 DIAGNOSIS — Z1159 Encounter for screening for other viral diseases: Secondary | ICD-10-CM

## 2021-12-11 DIAGNOSIS — Z13228 Encounter for screening for other metabolic disorders: Secondary | ICD-10-CM | POA: Diagnosis not present

## 2021-12-11 DIAGNOSIS — Z130 Encounter for screening for diseases of the blood and blood-forming organs and certain disorders involving the immune mechanism: Secondary | ICD-10-CM

## 2021-12-11 LAB — COMPREHENSIVE METABOLIC PANEL
ALT: 12 U/L (ref 0–35)
AST: 11 U/L (ref 0–37)
Albumin: 4.7 g/dL (ref 3.5–5.2)
Alkaline Phosphatase: 50 U/L (ref 39–117)
BUN: 15 mg/dL (ref 6–23)
CO2: 29 mEq/L (ref 19–32)
Calcium: 9.7 mg/dL (ref 8.4–10.5)
Chloride: 103 mEq/L (ref 96–112)
Creatinine, Ser: 0.74 mg/dL (ref 0.40–1.20)
GFR: 101.59 mL/min (ref >60.00)
Glucose, Bld: 89 mg/dL (ref 70–99)
Potassium: 4.4 mEq/L (ref 3.5–5.1)
Sodium: 138 mEq/L (ref 135–145)
Total Bilirubin: 0.7 mg/dL (ref 0.2–1.2)
Total Protein: 6.9 g/dL (ref 6.0–8.3)

## 2021-12-11 LAB — CBC WITH DIFFERENTIAL/PLATELET
Basophils Absolute: 0.1 10*3/uL (ref 0.0–0.1)
Basophils Relative: 0.9 % (ref 0.0–3.0)
Eosinophils Absolute: 0.1 10*3/uL (ref 0.0–0.7)
Eosinophils Relative: 1.8 % (ref 0.0–5.0)
HCT: 44 % (ref 36.0–46.0)
Hemoglobin: 14.6 g/dL (ref 12.0–15.0)
Lymphocytes Relative: 22.3 % (ref 12.0–46.0)
Lymphs Abs: 1.7 10*3/uL (ref 0.7–4.0)
MCHC: 33.1 g/dL (ref 30.0–36.0)
MCV: 90.4 fl (ref 78.0–100.0)
Monocytes Absolute: 0.5 10*3/uL (ref 0.1–1.0)
Monocytes Relative: 6.7 % (ref 3.0–12.0)
Neutro Abs: 5.1 10*3/uL (ref 1.4–7.7)
Neutrophils Relative %: 68.3 % (ref 43.0–77.0)
Platelets: 225 10*3/uL (ref 150.0–400.0)
RBC: 4.87 Mil/uL (ref 3.87–5.11)
RDW: 12.5 % (ref 11.5–15.5)
WBC: 7.5 10*3/uL (ref 4.0–10.5)

## 2021-12-11 LAB — LIPID PANEL
Cholesterol: 176 mg/dL (ref 0–200)
HDL: 55 mg/dL (ref >39.00)
LDL Cholesterol: 105 mg/dL — ABNORMAL HIGH (ref 0–99)
NonHDL: 121.44
Total CHOL/HDL Ratio: 3
Triglycerides: 82 mg/dL (ref 0.0–149.0)
VLDL: 16.4 mg/dL (ref 0.0–40.0)

## 2021-12-11 LAB — HEMOGLOBIN A1C: Hgb A1c MFr Bld: 5.3 % (ref 4.6–6.5)

## 2021-12-11 LAB — TSH: TSH: 1.5 u[IU]/mL (ref 0.35–5.50)

## 2021-12-11 MED ORDER — HYDROCORTISONE 2.5 % EX CREA: TOPICAL_CREAM | Freq: Two times a day (BID) | CUTANEOUS | 0 refills | Status: AC

## 2021-12-11 MED ORDER — EPINEPHRINE 0.1 MG/0.1ML IJ SOAJ: 1.0000 | Freq: Once | INTRAMUSCULAR | 2 refills | Status: AC

## 2021-12-11 MED ORDER — MONTELUKAST SODIUM 10 MG PO TABS: 10.0000 mg | ORAL_TABLET | Freq: Every day | ORAL | 3 refills | Status: AC

## 2021-12-11 MED ORDER — ALBUTEROL SULFATE HFA 108 (90 BASE) MCG/ACT IN AERS: 2.0000 | INHALATION_SPRAY | Freq: Four times a day (QID) | RESPIRATORY_TRACT | 11 refills | Status: AC | PRN

## 2021-12-11 NOTE — Patient Instructions (Addendum)
Vanessa Sellers -   Randie Heinz to meet you!  Start montelukast and albuterol as directed.  Can use hydrocortisone rectal cream twice daily as needed.  See you annually for a physical and labs, sooner if you need anything (physicals are preventative care and generally covered in full by insurance).  I'll call if lab results show any concerns  Thank you,  Rich     If you have lab work done today you will be contacted with your lab results within the next 2 weeks.  If you have not heard from [place] then please contact us. The fastest way to get your results is to register for My Chart.   IF you received an x-ray today, you will receive an invoice from [facility]. Please contact [facility] at [phone] with questions or concerns regarding your invoice.   IF you received labwork today, you will receive an invoice from [facility]. Please contact [facility] at [phone] with questions or concerns regarding your invoice.   Our billing staff will not be able to assist you with questions regarding bills from these companies.  You will be contacted with the lab results as soon as they are available. The fastest way to get your results is to activate your My Chart account. Instructions are located on the last page of this paperwork. If you have not heard from [place] regarding the results in 2 weeks, please contact this office.

## 2021-12-11 NOTE — Telephone Encounter (Signed)
If we could call pharmacy - ok to change to epi-pen  Thanks,  Rich

## 2021-12-11 NOTE — Progress Notes (Signed)
Established Patient Office Visit  Subjective:  Patient ID: Vanessa Sellers, female    DOB: [birth year]  Age: 41 y.o. MRN: [id number]  CC:  Chief Complaint  Patient presents with   New Patient (Initial Visit)    Patient states she is here to establish care. Pt would like to discuss some minor concerns.    HPI Vanessa Sellers presents for est care  Formerly pt of [place], [place].  Asthma Has had wheezing, used mother in laws inhaler  She does exercise regularly 4-5x per week Exercise induced shob, doe.  Notes she did get allergy shots regularly. Does take OTC antihistamine daily - thinks it's cetirizine  Recurrent illness Once every 4-6 weeks gets swollen glands, fatigue, malaise, sore throat Consistently tests negative for COVID, flu, strep, etc.  No hx of COVID. Does not think this is related to COVID.   Internal hemorrhoids Noted by obgyn Occ brbpr No weight changes, abd pain, nvd, constipation Has not treated Has referred to GI, has not made appt Gma w hx of colorectal ca.  Past Medical History:  Diagnosis Date   Allergy    Seasonal   History of chickenpox    Medical history non-contributory     Past Surgical History:  Procedure Laterality Date   NO PAST SURGERIES     TONSILLECTOMY AND ADENOIDECTOMY  1987    Family History  Problem Relation Age of Onset   Healthy Mother    Hypertension Father    Hypertension Brother    COPD Maternal Grandmother    Heart disease Maternal Grandmother    Colon cancer Maternal Grandmother    Hyperlipidemia Maternal Grandfather    Hypertension Maternal Grandfather    Diabetes Maternal Grandfather    Heart disease Maternal Grandfather    Diabetes Paternal Grandmother    Hyperlipidemia Paternal Grandfather    Hypertension Paternal Grandfather    Cancer Paternal Grandfather        Lung   COPD Paternal Grandfather    Diabetes Paternal Grandfather    Heart disease Paternal Grandfather    Hypertension Maternal Uncle     Hyperlipidemia Maternal Uncle    Heart attack Maternal Uncle 48    Social History   Socioeconomic History   Marital status: Married    Spouse name: Not on file   Number of children: 3   Years of education: Not on file   Highest education level: Not on file  Occupational History   Occupation: Art therapist  Tobacco Use   Smoking status: Never   Smokeless tobacco: Never  Vaping Use   Vaping Use: Never used  Substance and Sexual Activity   Alcohol use: Yes    Comment: 2 beers per week   Drug use: No   Sexual activity: Yes    Birth control/protection: I.U.D.    Comment: Mirena  Other Topics Concern   Not on file  Social History Narrative   Not on file   Social Determinants of Health   Financial Resource Strain: Not on file  Food Insecurity: Not on file  Transportation Needs: Not on file  Physical Activity: Not on file  Stress: Not on file  Social Connections: Not on file  Intimate Partner Violence: Not on file    Outpatient Medications Prior to Visit  Medication Sig Dispense Refill   FLUoxetine (PROZAC) 20 MG capsule Take 1 capsule by mouth daily.  3   ibuprofen (ADVIL,MOTRIN) 600 MG tablet Take 1 tablet (600 mg total) by mouth every 6 (  six) hours as needed. 90 tablet 0   levonorgestrel (MIRENA) 20 MCG/24HR IUD Mirena 20 mcg/24 hr (5 years) intrauterine device  Take 1 device by intrauterine route.     Multiple Vitamins-Minerals (MULTIVITAMIN WOMEN PO) Take 1 tablet by mouth daily.     No facility-administered medications prior to visit.    Allergies  Allergen Reactions   Bee Venom Anaphylaxis    ROS Review of Systems  Constitutional: Negative.   HENT: Negative.    Eyes: Negative.   Respiratory: Negative.    Cardiovascular: Negative.   Gastrointestinal: Negative.   Genitourinary: Negative.   Musculoskeletal: Negative.   Skin: Negative.   Neurological: Negative.   Psychiatric/Behavioral: Negative.    All other systems reviewed and are negative.     Objective:    Physical Exam Vitals and nursing note reviewed.  Constitutional:      General: She is not in acute distress.    Appearance: Normal appearance. She is normal weight. She is not ill-appearing, toxic-appearing or diaphoretic.  Cardiovascular:     Rate and Rhythm: Normal rate and regular rhythm.     Heart sounds: Normal heart sounds. No murmur heard.   No friction rub. No gallop.  Pulmonary:     Effort: Pulmonary effort is normal. No respiratory distress.     Breath sounds: Normal breath sounds. No stridor. No wheezing, rhonchi or rales.  Chest:     Chest wall: No tenderness.  Skin:    General: Skin is warm and dry.  Neurological:     General: No focal deficit present.     Mental Status: She is alert and oriented to person, place, and time. Mental status is at baseline.  Psychiatric:        Mood and Affect: Mood normal.        Behavior: Behavior normal.        Thought Content: Thought content normal.        Judgment: Judgment normal.    BP 117/76    Pulse 71    Temp 98.2 F (36.8 C) (Temporal)    Resp 17    Ht 5' 8.26" (1.734 m)    Wt 157 lb 9.6 oz (71.5 kg)    SpO2 100%    BMI 23.78 kg/m  Wt Readings from Last 3 Encounters:  12/11/21 157 lb 9.6 oz (71.5 kg)  06/21/17 185 lb (83.9 kg)  04/30/16 210 lb (95.3 kg)     Health Maintenance Due  Topic Date Due   COVID-19 Vaccine (1) Never done   Hepatitis C Screening  Never done    There are no preventive care reminders to display for this patient.  Lab Results  Component Value Date   TSH 1.57 06/21/2017   Lab Results  Component Value Date   WBC 8.8 06/21/2017   HGB 13.9 06/21/2017   HCT 41.5 06/21/2017   MCV 91.6 06/21/2017   PLT 303.0 06/21/2017   No results found for: NA, K, CHLORIDE, CO2, GLUCOSE, BUN, CREATININE, BILITOT, ALKPHOS, AST, ALT, PROT, ALBUMIN, CALCIUM, ANIONGAP, EGFR, GFR No results found for: CHOL No results found for: HDL No results found for: LDLCALC No results found for: TRIG No  results found for: CHOLHDL No results found for: HGBA1C    Assessment & Plan:   Problem List Items Addressed This Visit   None Visit Diagnoses     Wheezing    -  Primary   Relevant Medications   albuterol (VENTOLIN HFA) 108 (90 Base) MCG/ACT inhaler  montelukast (SINGULAIR) 10 MG tablet   History of anaphylaxis       Relevant Medications   EPINEPHrine 0.1 MG/0.1ML SOAJ   Screening for endocrine, metabolic and immunity disorder       Relevant Orders   Comprehensive metabolic panel   Hemoglobin A1c   CBC with Differential/Platelet   TSH   Lipid screening       Relevant Orders   Lipid panel   Encounter for screening for other viral diseases       Relevant Orders   Hepatitis C Antibody   Internal hemorrhoids       Relevant Medications   EPINEPHrine 0.1 MG/0.1ML SOAJ   hydrocortisone 2.5 % cream       Meds ordered this encounter  Medications   albuterol (VENTOLIN HFA) 108 (90 Base) MCG/ACT inhaler    Sig: Inhale 2 puffs into the lungs every 6 (six) hours as needed for wheezing or shortness of breath.    Dispense:  18 g    Refill:  11    Order Specific Question:   Supervising Provider    Answer:   Carlota Raspberry, JEFFREY R [[id number]]   montelukast (SINGULAIR) 10 MG tablet    Sig: Take 1 tablet (10 mg total) by mouth at bedtime.    Dispense:  90 tablet    Refill:  3    Order Specific Question:   Supervising Provider    Answer:   Carlota Raspberry, JEFFREY R [[id number]]   EPINEPHrine 0.1 MG/0.1ML SOAJ    Sig: Inject 1 each as directed once for 1 dose.    Dispense:  2 each    Refill:  2    Order Specific Question:   Supervising Provider    Answer:   Carlota Raspberry, JEFFREY R [[id number]]   hydrocortisone 2.5 % cream    Sig: Apply topically 2 (two) times daily.    Dispense:  30 g    Refill:  0    Order Specific Question:   Supervising Provider    Answer:   Carlota Raspberry, JEFFREY R [[id number]]    Follow-up: Return in about 1 year (around 12/11/2022) for CPE and labs.   PLAN Montelukast and albuterol for  allergies/wheezing. Return if worsening or failing to improve. Can consider allergy referral but pt prefers to avoid this. Refill epi-pen given hx of anaphylaxis Send hydrocortisone 2.5% for rectal use bid prn for hemorrhoids Labs collected. Will follow up with the patient as warranted. Return annually for CPE and labs Patient encouraged to call clinic with any questions, comments, or concerns.  Maximiano Coss, NP

## 2021-12-17 NOTE — Telephone Encounter (Signed)
Patient pharmacy is changing Auvi to the Epi Pen.

## 2022-02-08 DIAGNOSIS — K6289 Other specified diseases of anus and rectum: Secondary | ICD-10-CM | POA: Diagnosis not present

## 2022-02-08 DIAGNOSIS — K625 Hemorrhage of anus and rectum: Secondary | ICD-10-CM | POA: Diagnosis not present

## 2022-02-08 DIAGNOSIS — K640 First degree hemorrhoids: Secondary | ICD-10-CM | POA: Diagnosis not present

## 2022-02-19 ENCOUNTER — Encounter: Payer: Self-pay | Admitting: Registered Nurse

## 2022-07-23 DIAGNOSIS — R109 Unspecified abdominal pain: Secondary | ICD-10-CM | POA: Diagnosis not present

## 2022-07-23 DIAGNOSIS — R1013 Epigastric pain: Secondary | ICD-10-CM | POA: Diagnosis not present

## 2022-07-29 ENCOUNTER — Other Ambulatory Visit: Payer: Self-pay | Admitting: General Surgery

## 2022-07-29 DIAGNOSIS — R1013 Epigastric pain: Secondary | ICD-10-CM

## 2022-08-06 DIAGNOSIS — Z1231 Encounter for screening mammogram for malignant neoplasm of breast: Secondary | ICD-10-CM | POA: Diagnosis not present

## 2022-08-06 DIAGNOSIS — Z01419 Encounter for gynecological examination (general) (routine) without abnormal findings: Secondary | ICD-10-CM | POA: Diagnosis not present

## 2022-08-06 DIAGNOSIS — N760 Acute vaginitis: Secondary | ICD-10-CM | POA: Diagnosis not present

## 2022-08-06 DIAGNOSIS — R350 Frequency of micturition: Secondary | ICD-10-CM | POA: Diagnosis not present

## 2022-08-06 DIAGNOSIS — Z124 Encounter for screening for malignant neoplasm of cervix: Secondary | ICD-10-CM | POA: Diagnosis not present

## 2022-12-17 ENCOUNTER — Encounter: Payer: BC Managed Care – PPO | Admitting: Registered Nurse
